# Patient Record
Sex: Female | Born: 1965
Health system: Southern US, Community
[De-identification: ages and names within clinical notes are randomized; demographics above are authoritative.]

## PROBLEM LIST (undated history)

## (undated) DIAGNOSIS — T7840XA Allergy, unspecified, initial encounter: Secondary | ICD-10-CM

## (undated) DIAGNOSIS — R5383 Other fatigue: Secondary | ICD-10-CM

## (undated) DIAGNOSIS — N760 Acute vaginitis: Secondary | ICD-10-CM

## (undated) DIAGNOSIS — D573 Sickle-cell trait: Secondary | ICD-10-CM

## (undated) DIAGNOSIS — E049 Nontoxic goiter, unspecified: Secondary | ICD-10-CM

## (undated) DIAGNOSIS — IMO0002 Reserved for concepts with insufficient information to code with codable children: Secondary | ICD-10-CM

## (undated) DIAGNOSIS — Z8619 Personal history of other infectious and parasitic diseases: Secondary | ICD-10-CM

## (undated) DIAGNOSIS — F329 Major depressive disorder, single episode, unspecified: Secondary | ICD-10-CM

## (undated) DIAGNOSIS — Z0000 Encounter for general adult medical examination without abnormal findings: Secondary | ICD-10-CM

## (undated) DIAGNOSIS — J302 Other seasonal allergic rhinitis: Secondary | ICD-10-CM

## (undated) DIAGNOSIS — F32A Depression, unspecified: Secondary | ICD-10-CM

## (undated) DIAGNOSIS — K529 Noninfective gastroenteritis and colitis, unspecified: Principal | ICD-10-CM

## (undated) DIAGNOSIS — J4520 Mild intermittent asthma, uncomplicated: Secondary | ICD-10-CM

## (undated) DIAGNOSIS — H547 Unspecified visual loss: Secondary | ICD-10-CM

## (undated) DIAGNOSIS — A048 Other specified bacterial intestinal infections: Secondary | ICD-10-CM

## (undated) DIAGNOSIS — M545 Low back pain: Secondary | ICD-10-CM

## (undated) HISTORY — DX: Other seasonal allergic rhinitis: J30.2

## (undated) HISTORY — DX: Nontoxic goiter, unspecified: E04.9

## (undated) HISTORY — DX: Reserved for concepts with insufficient information to code with codable children: IMO0002

## (undated) HISTORY — DX: Mild intermittent asthma, uncomplicated: J45.20

## (undated) HISTORY — DX: Major depressive disorder, single episode, unspecified: F32.9

## (undated) HISTORY — DX: Other specified bacterial intestinal infections: A04.8

## (undated) HISTORY — DX: Low back pain: M54.5

## (undated) HISTORY — DX: Allergy, unspecified, initial encounter: T78.40XA

## (undated) HISTORY — DX: Depression, unspecified: F32.A

## (undated) HISTORY — DX: Noninfective gastroenteritis and colitis, unspecified: K52.9

## (undated) HISTORY — DX: Unspecified visual loss: H54.7

## (undated) HISTORY — DX: Sickle-cell trait: D57.3

## (undated) HISTORY — DX: Other fatigue: R53.83

## (undated) HISTORY — DX: Personal history of other infectious and parasitic diseases: Z86.19

## (undated) HISTORY — DX: Acute vaginitis: N76.0

## (undated) HISTORY — DX: Encounter for general adult medical examination without abnormal findings: Z00.00

---

## 2009-06-05 LAB — HM MAMMOGRAPHY: HM Mammogram: NORMAL

## 2010-05-29 LAB — HM PAP SMEAR: HM Pap smear: NORMAL

## 2011-01-20 ENCOUNTER — Emergency Department (HOSPITAL_BASED_OUTPATIENT_CLINIC_OR_DEPARTMENT_OTHER)
Admission: EM | Admit: 2011-01-20 | Discharge: 2011-01-21 | Disposition: A | Discharge status: Home / Self Care | Payer: BC Managed Care – PPO | Attending: Emergency Medicine | Admitting: Emergency Medicine

## 2011-01-20 ENCOUNTER — Encounter: Payer: Self-pay | Admitting: *Deleted

## 2011-01-20 DIAGNOSIS — B349 Viral infection, unspecified: Secondary | ICD-10-CM

## 2011-01-20 DIAGNOSIS — J3489 Other specified disorders of nose and nasal sinuses: Secondary | ICD-10-CM | POA: Insufficient documentation

## 2011-01-20 DIAGNOSIS — B9789 Other viral agents as the cause of diseases classified elsewhere: Secondary | ICD-10-CM | POA: Insufficient documentation

## 2011-01-20 NOTE — ED Notes (Signed)
C/o inus pressure and cough x 2 days

## 2011-01-21 MED ORDER — OSELTAMIVIR PHOSPHATE 75 MG PO CAPS: 75.0000 mg | ORAL_CAPSULE | Freq: Two times a day (BID) | ORAL | Status: AC

## 2011-01-21 NOTE — ED Provider Notes (Signed)
History    This chart was scribed for Anne Co, MD, MD by Smitty Pluck. The patient was seen in room [facility] and the patient's care was started at 12:25AM.   CSN: [id number]  Arrival date & time 01/20/11  2315   First MD Initiated Contact with Patient 01/21/11 0004      Chief Complaint  Patient presents with  . Facial Pain    (Consider location/radiation/quality/duration/timing/severity/associated sxs/prior treatment) The history is provided by the patient.   Anne Huffman is a 45 y.o. female who presents to the Emergency Department complaining of moderate facial pain and pressure onset 2 days ago. Pt also has nasal congestion, cough, sore throat and generalized body aches. Pt denies chest pain, SOB, abdominal pain, diarrhea and vomiting. Pt has had sick contacts with relatives. She reports the symptoms are constant since onset.  PCP was Dr. Melvyn Novas.   History reviewed. No pertinent past medical history.  History reviewed. No pertinent past surgical history.  History reviewed. No pertinent family history.  History  Substance Use Topics  . Smoking status: Never Smoker   . Smokeless tobacco: Not on file  . Alcohol Use: No    OB History    Grav Para Term Preterm Abortions TAB SAB Ect Mult Living                  Review of Systems  All other systems reviewed and are negative.   10 Systems reviewed and are negative for acute change except as noted in the HPI.  Allergies  Review of patient's allergies indicates no known allergies.  Home Medications  No current outpatient prescriptions on file.  BP 133/81  Pulse 101  Temp(Src) 99 F (37.2 C) (Oral)  Resp 18  Ht 5\' 2"  (1.575 m)  Wt 140 lb (63.504 kg)  BMI 25.61 kg/m2  SpO2 100%  LMP 01/18/2011  Physical Exam  Nursing note and vitals reviewed. Constitutional: She is oriented to person, place, and time. She appears well-developed and well-nourished. No distress.  HENT:  Head: Normocephalic and atraumatic.    Right Ear: External ear normal.  Left Ear: External ear normal.  Mouth/Throat: Oropharynx is clear and moist. No oropharyngeal exudate.       Uvula midline  Tolerant secretions Airway patent   Eyes: EOM are normal. Pupils are equal, round, and reactive to light.  Neck: Normal range of motion. Neck supple. No tracheal deviation present.  Cardiovascular: Normal rate, regular rhythm and normal heart sounds.   Pulmonary/Chest: Effort normal. No respiratory distress.  Abdominal: Soft. She exhibits no distension.  Musculoskeletal: Normal range of motion.  Neurological: She is alert and oriented to person, place, and time.  Skin: Skin is warm and dry.  Psychiatric: She has a normal mood and affect. Her behavior is normal.    ED Course  Procedures (including critical care time)  DIAGNOSTIC STUDIES: Oxygen Saturation is 100% on room air, normal by my interpretation.    COORDINATION OF CARE:    Labs Reviewed - No data to display No results found.   1. Viral syndrome       MDM  The patient's symptoms are consistent with a viral syndrome.  Given the significant amount of influenza around I will cover the patient with Tamiflu and recommend close PCP followup    I personally performed the services described in this documentation, which was scribed in my presence. The recorded information has been reviewed and considered.    Anne Co, MD 01/21/11  0106 

## 2011-03-06 ENCOUNTER — Ambulatory Visit: Payer: BC Managed Care – PPO | Admitting: Internal Medicine

## 2011-03-11 ENCOUNTER — Encounter: Payer: Self-pay | Admitting: Internal Medicine

## 2011-03-11 ENCOUNTER — Ambulatory Visit (INDEPENDENT_AMBULATORY_CARE_PROVIDER_SITE_OTHER): Payer: BC Managed Care – PPO | Admitting: Internal Medicine

## 2011-03-11 DIAGNOSIS — J45909 Unspecified asthma, uncomplicated: Secondary | ICD-10-CM

## 2011-03-11 DIAGNOSIS — R748 Abnormal levels of other serum enzymes: Secondary | ICD-10-CM | POA: Insufficient documentation

## 2011-03-11 DIAGNOSIS — Z1239 Encounter for other screening for malignant neoplasm of breast: Secondary | ICD-10-CM

## 2011-03-11 DIAGNOSIS — J4520 Mild intermittent asthma, uncomplicated: Secondary | ICD-10-CM | POA: Insufficient documentation

## 2011-03-11 DIAGNOSIS — D649 Anemia, unspecified: Secondary | ICD-10-CM | POA: Insufficient documentation

## 2011-03-11 DIAGNOSIS — R7401 Elevation of levels of liver transaminase levels: Secondary | ICD-10-CM

## 2011-03-11 HISTORY — DX: Mild intermittent asthma, uncomplicated: J45.20

## 2011-03-11 HISTORY — PX: NO PAST SURGERIES: SHX2092

## 2011-03-11 MED ORDER — ALBUTEROL 90 MCG/ACT IN AERS: 2.0000 | INHALATION_SPRAY | Freq: Four times a day (QID) | RESPIRATORY_TRACT | Status: DC | PRN

## 2011-03-11 NOTE — Assessment & Plan Note (Signed)
Obtain cbc. Bring previous labs to clinic for review

## 2011-03-11 NOTE — Assessment & Plan Note (Signed)
Asx. Provide prescription for albuterol prn rescue inhaler

## 2011-03-11 NOTE — Assessment & Plan Note (Signed)
Obtain lft. Bring previous labs/records to clinic for review

## 2011-03-11 NOTE — Assessment & Plan Note (Signed)
Schedule annual mammogram. Recommend monthly self breast exam.

## 2011-03-11 NOTE — Progress Notes (Signed)
  Subjective:    Patient ID: Anne Huffman, female    DOB: Jun 03, 1965, 46 y.o.   MRN: [id number]  HPI  Pt presents to clinic to establish care and followup of multiple medical problems. H/o asthma with no recent flare, dyspnea or wheezing. Does not have rescue inhaler at home. H/o mild depression which has not required medication and which she believes is under control. Pap utd 5/12 and nl and last mammogram 2011 but willing to schedule for annual f/u. Recalls labs 6/12 through previous pmd and believes was told anemic vs leukopenic and also ? Abn lft with subsequent abdominal US. Has copy of labs at home. Believes tetanus utd.  Past Medical History  Diagnosis Date  . Asthma     since childhood  . History of chicken pox     childhood  . Depression     no treatment  . Ulcer     stomach  . Seasonal allergies    Past Surgical History  Procedure Date  . No past surgeries 03/11/2011    reports that she has never smoked. She has never used smokeless tobacco. She reports that she does not drink alcohol or use illicit drugs. family history includes Breast cancer in her maternal grandmother and Hypertension in her mother.  There is no history of Prostate cancer, and Colon cancer, and Heart disease, and Diabetes, . No Known Allergies   Review of Systems  Respiratory: Negative for cough, shortness of breath and wheezing.   Cardiovascular: Negative for chest pain.  Gastrointestinal: Negative for blood in stool.  All other systems reviewed and are negative.       Objective:   Physical Exam  Physical Exam  Nursing note and vitals reviewed. Constitutional: Appears well-developed and well-nourished. No distress.  HENT:  Head: Normocephalic and atraumatic.  Right Ear: External ear normal.  Left Ear: External ear normal.  Eyes: Conjunctivae are normal. No scleral icterus.  Neck: Neck supple. Carotid bruit is not present.  Cardiovascular: Normal rate, regular rhythm and normal heart  sounds.  Exam reveals no gallop and no friction rub.   No murmur heard. Pulmonary/Chest: Effort normal and breath sounds normal. No respiratory distress. He has no wheezes. no rales.  Lymphadenopathy:    He has no cervical adenopathy.  Neurological:Alert.  Skin: Skin is warm and dry. Not diaphoretic.  Psychiatric: Has a normal mood and affect.         Assessment & Plan:

## 2011-03-12 LAB — CBC WITH DIFFERENTIAL/PLATELET
Basophils Absolute: 0 10*3/uL (ref 0.0–0.1)
Basophils Relative: 0 % (ref 0–1)
Eosinophils Absolute: 0.2 10*3/uL (ref 0.0–0.7)
Eosinophils Relative: 4 % (ref 0–5)
HCT: 38.9 % (ref 36.0–46.0)
Hemoglobin: 13.1 g/dL (ref 12.0–15.0)
Lymphocytes Relative: 34 % (ref 12–46)
Lymphs Abs: 2.1 10*3/uL (ref 0.7–4.0)
MCH: 29.3 pg (ref 26.0–34.0)
MCHC: 33.7 g/dL (ref 30.0–36.0)
MCV: 87 fL (ref 78.0–100.0)
Monocytes Absolute: 0.5 10*3/uL (ref 0.1–1.0)
Monocytes Relative: 7 % (ref 3–12)
Neutro Abs: 3.4 10*3/uL (ref 1.7–7.7)
Neutrophils Relative %: 56 % (ref 43–77)
Platelets: 346 10*3/uL (ref 150–400)
RBC: 4.47 MIL/uL (ref 3.87–5.11)
RDW: 12.8 % (ref 11.5–15.5)
WBC: 6.2 10*3/uL (ref 4.0–10.5)

## 2011-03-12 LAB — HEPATIC FUNCTION PANEL
ALT: 10 U/L (ref 0–35)
AST: 14 U/L (ref 0–37)
Albumin: 4 g/dL (ref 3.5–5.2)
Alkaline Phosphatase: 71 U/L (ref 39–117)
Bilirubin, Direct: 0.1 mg/dL (ref 0.0–0.3)
Indirect Bilirubin: 0.3 mg/dL (ref 0.0–0.9)
Total Bilirubin: 0.4 mg/dL (ref 0.3–1.2)
Total Protein: 7.2 g/dL (ref 6.0–8.3)

## 2011-03-29 ENCOUNTER — Telehealth: Payer: Self-pay | Admitting: Internal Medicine

## 2011-03-29 NOTE — Telephone Encounter (Signed)
Received medical records from [facility]

## 2011-04-02 LAB — HM MAMMOGRAPHY

## 2011-04-03 ENCOUNTER — Encounter: Payer: Self-pay | Admitting: Internal Medicine

## 2011-04-04 ENCOUNTER — Ambulatory Visit (HOSPITAL_BASED_OUTPATIENT_CLINIC_OR_DEPARTMENT_OTHER)
Admission: RE | Admit: 2011-04-04 | Discharge: 2011-04-04 | Disposition: A | Discharge status: Home / Self Care | Payer: BC Managed Care – PPO | Source: Ambulatory Visit | Attending: Internal Medicine | Admitting: Internal Medicine

## 2011-04-04 ENCOUNTER — Encounter: Payer: Self-pay | Admitting: Internal Medicine

## 2011-04-04 ENCOUNTER — Ambulatory Visit (INDEPENDENT_AMBULATORY_CARE_PROVIDER_SITE_OTHER): Payer: BC Managed Care – PPO | Admitting: Internal Medicine

## 2011-04-04 VITALS — BP 120/78 | HR 83 | Temp 98.1°F | Resp 16 | Ht 63.0 in | Wt 160.0 lb

## 2011-04-04 DIAGNOSIS — IMO0002 Reserved for concepts with insufficient information to code with codable children: Secondary | ICD-10-CM

## 2011-04-04 DIAGNOSIS — M25519 Pain in unspecified shoulder: Secondary | ICD-10-CM

## 2011-04-04 DIAGNOSIS — M792 Neuralgia and neuritis, unspecified: Secondary | ICD-10-CM

## 2011-04-04 DIAGNOSIS — R918 Other nonspecific abnormal finding of lung field: Secondary | ICD-10-CM

## 2011-04-04 DIAGNOSIS — M542 Cervicalgia: Secondary | ICD-10-CM

## 2011-04-04 MED ORDER — METHYLPREDNISOLONE 4 MG PO KIT: PACK | ORAL | Status: AC

## 2011-04-06 DIAGNOSIS — M792 Neuralgia and neuritis, unspecified: Secondary | ICD-10-CM | POA: Insufficient documentation

## 2011-04-06 DIAGNOSIS — M542 Cervicalgia: Secondary | ICD-10-CM | POA: Insufficient documentation

## 2011-04-06 NOTE — Assessment & Plan Note (Signed)
Plain xray of cpsine. Attempt medrol dosepak. Work note provided. Followup if no improvement or worsening.

## 2011-04-06 NOTE — Assessment & Plan Note (Signed)
Obtain plain xray. 

## 2011-04-06 NOTE — Progress Notes (Signed)
  Subjective:    Patient ID: Anne Huffman, female    DOB: 08-02-1965, 46 y.o.   MRN: [id number]  HPI Pt presents to clinic for evaluation of hand and arm pain. Notes 2 d h/o right arm pain that radiates to hand. +hand paresthesia without muscle weakness, neck pain or h/o injury. Taking no medication for the problem. No alleviating or exacerbating factors. States has experienced a similar episode months ago that resolved spontaneously.  Past Medical History  Diagnosis Date  . Asthma     since childhood  . History of chicken pox     childhood  . Depression     no treatment  . Ulcer     stomach  . Seasonal allergies    Past Surgical History  Procedure Date  . No past surgeries 03/11/2011    reports that she has never smoked. She has never used smokeless tobacco. She reports that she does not drink alcohol or use illicit drugs. family history includes Breast cancer in her maternal grandmother and Hypertension in her mother.  There is no history of Prostate cancer, and Colon cancer, and Heart disease, and Diabetes, . No Known Allergies   Review of Systems see hpi     Objective:   Physical Exam  Nursing note and vitals reviewed. Constitutional: She appears well-developed and well-nourished. No distress.  HENT:  Head: Normocephalic and atraumatic.  Musculoskeletal:       FROM right shoulder and arm. +ant shoulder tenderness. No crepitus. No post neck pain. Right distal hand strength 5/5.  Neurological: She is alert.  Skin: Skin is warm and dry. She is not diaphoretic.          Assessment & Plan:

## 2011-04-11 ENCOUNTER — Other Ambulatory Visit: Payer: Self-pay | Admitting: Internal Medicine

## 2011-04-11 DIAGNOSIS — J984 Other disorders of lung: Secondary | ICD-10-CM

## 2011-04-15 ENCOUNTER — Ambulatory Visit (HOSPITAL_BASED_OUTPATIENT_CLINIC_OR_DEPARTMENT_OTHER)
Admission: RE | Admit: 2011-04-15 | Discharge: 2011-04-15 | Disposition: A | Discharge status: Home / Self Care | Payer: BC Managed Care – PPO | Source: Ambulatory Visit | Attending: Internal Medicine | Admitting: Internal Medicine

## 2011-04-15 ENCOUNTER — Other Ambulatory Visit: Payer: Self-pay | Admitting: Internal Medicine

## 2011-04-15 DIAGNOSIS — J984 Other disorders of lung: Secondary | ICD-10-CM

## 2011-04-15 DIAGNOSIS — R918 Other nonspecific abnormal finding of lung field: Secondary | ICD-10-CM

## 2011-04-26 ENCOUNTER — Ambulatory Visit: Payer: BC Managed Care – PPO | Admitting: Internal Medicine

## 2011-04-26 DIAGNOSIS — Z0289 Encounter for other administrative examinations: Secondary | ICD-10-CM

## 2011-05-03 ENCOUNTER — Telehealth: Payer: Self-pay | Admitting: *Deleted

## 2011-05-03 ENCOUNTER — Ambulatory Visit: Payer: BC Managed Care – PPO | Admitting: Internal Medicine

## 2011-05-03 DIAGNOSIS — Z0289 Encounter for other administrative examinations: Secondary | ICD-10-CM

## 2011-05-03 NOTE — Telephone Encounter (Signed)
Call placed to patient at [phone], no answer. A voice message was left for patient to return call regarding scheduling of CT.

## 2011-05-06 NOTE — Telephone Encounter (Signed)
Call placed to patient at [phone], voice recording reached; stating "the person you have called is unavailable please try your call again later".

## 2011-05-07 NOTE — Telephone Encounter (Signed)
Call placed to patient at [phone], voice recording reached stating "the person you have called is unavailable, please try your call again later". Letter mailed to patients address on file. She was advised to contact office if she would like to proceed with CT Scan.

## 2011-05-13 ENCOUNTER — Telehealth: Payer: Self-pay | Admitting: *Deleted

## 2011-05-13 ENCOUNTER — Other Ambulatory Visit: Payer: Self-pay | Admitting: Internal Medicine

## 2011-05-13 DIAGNOSIS — R911 Solitary pulmonary nodule: Secondary | ICD-10-CM

## 2011-05-13 NOTE — Telephone Encounter (Signed)
Has clinic appt tomorrow to discuss. Can cancel that appt if she wants. i'll order ct. May need help to get someone to look at report friday

## 2011-05-13 NOTE — Telephone Encounter (Signed)
Patient returned phone call stating she would like to proceed with having the CT Scan done on Friday 05/17/2011. Her message stated that she will be off that day and would be able to come in at any time.

## 2011-05-14 NOTE — Telephone Encounter (Signed)
Call placed to patient at [phone], voice recording reached stating the person you have called is unavailable right now, please try your call again late. Call placed to patient at [phone], same voice recording reached. Unable to leave message.

## 2011-05-15 ENCOUNTER — Ambulatory Visit (INDEPENDENT_AMBULATORY_CARE_PROVIDER_SITE_OTHER): Payer: BC Managed Care – PPO | Admitting: Internal Medicine

## 2011-05-15 ENCOUNTER — Ambulatory Visit (HOSPITAL_BASED_OUTPATIENT_CLINIC_OR_DEPARTMENT_OTHER)
Admission: RE | Admit: 2011-05-15 | Discharge: 2011-05-15 | Disposition: A | Discharge status: Home / Self Care | Payer: BC Managed Care – PPO | Source: Ambulatory Visit | Attending: Internal Medicine | Admitting: Internal Medicine

## 2011-05-15 ENCOUNTER — Encounter: Payer: Self-pay | Admitting: Internal Medicine

## 2011-05-15 VITALS — BP 120/72 | HR 68 | Temp 98.6°F | Ht 63.0 in | Wt 166.0 lb

## 2011-05-15 DIAGNOSIS — R911 Solitary pulmonary nodule: Secondary | ICD-10-CM

## 2011-05-15 MED ORDER — IOHEXOL 300 MG/ML  SOLN
80.0000 mL | Freq: Once | INTRAMUSCULAR | Status: AC | PRN
  Administered 2011-05-15: 80 mL via INTRAVENOUS

## 2011-05-15 NOTE — Telephone Encounter (Signed)
Patient seen for office visit 05/15/2011.

## 2011-05-16 DIAGNOSIS — R911 Solitary pulmonary nodule: Secondary | ICD-10-CM | POA: Insufficient documentation

## 2011-05-16 NOTE — Assessment & Plan Note (Signed)
Proceed with chest ct with contrast.

## 2011-05-16 NOTE — Progress Notes (Signed)
  Subjective:    Patient ID: Anne Huffman, female    DOB: [birth year], 46 y.o.   MRN: [id number]  HPI Pt presents to clinic for evaluation of abnormal cxr. Seen for shoulder/arm pain essentially resolved with partial medrol dosepak. Plain xray of shoulder suggested possible lung abn. cxr performed and demonstrated possible 6mm rul nodule. No tobacco hx. Denies cough, sweats, wt loss or hemoptysis. No active complaint.  Past Medical History  Diagnosis Date  . Asthma     since childhood  . History of chicken pox     childhood  . Depression     no treatment  . Ulcer     stomach  . Seasonal allergies    Past Surgical History  Procedure Date  . No past surgeries 03/11/2011    reports that she has never smoked. She has never used smokeless tobacco. She reports that she does not drink alcohol or use illicit drugs. family history includes Breast cancer in her maternal grandmother and Hypertension in her mother.  There is no history of Prostate cancer, and Colon cancer, and Heart disease, and Diabetes, . No Known Allergies   Review of Systems see hpi     Objective:   Physical Exam  Nursing note and vitals reviewed. Constitutional: She appears well-developed and well-nourished. No distress.  Neurological: She is alert.  Skin: She is not diaphoretic.  Psychiatric: She has a normal mood and affect.          Assessment & Plan:

## 2011-05-17 ENCOUNTER — Other Ambulatory Visit (HOSPITAL_BASED_OUTPATIENT_CLINIC_OR_DEPARTMENT_OTHER): Payer: BC Managed Care – PPO

## 2011-05-23 ENCOUNTER — Telehealth: Payer: Self-pay | Admitting: Internal Medicine

## 2011-05-23 NOTE — Telephone Encounter (Signed)
Call placed to patient at [phone], she was advised of Ct results per Dr. Rodena Medin instructions, verbalized understanding and agrees as instructed.

## 2011-05-23 NOTE — Telephone Encounter (Signed)
She received a letter with her ct results Should she make an appointment to see Dr Rodena Medin about the results as she has some questions or should she wait the 12 months as the letter said.

## 2011-06-03 ENCOUNTER — Ambulatory Visit: Payer: BC Managed Care – PPO | Admitting: Internal Medicine

## 2011-06-03 DIAGNOSIS — Z0289 Encounter for other administrative examinations: Secondary | ICD-10-CM

## 2011-07-18 ENCOUNTER — Encounter: Payer: Self-pay | Admitting: Internal Medicine

## 2011-07-18 ENCOUNTER — Ambulatory Visit (INDEPENDENT_AMBULATORY_CARE_PROVIDER_SITE_OTHER): Payer: BC Managed Care – PPO | Admitting: Internal Medicine

## 2011-07-18 VITALS — BP 100/70 | HR 68 | Temp 98.5°F | Resp 18

## 2011-07-18 DIAGNOSIS — L255 Unspecified contact dermatitis due to plants, except food: Secondary | ICD-10-CM

## 2011-07-18 DIAGNOSIS — L309 Dermatitis, unspecified: Secondary | ICD-10-CM

## 2011-07-18 DIAGNOSIS — L259 Unspecified contact dermatitis, unspecified cause: Secondary | ICD-10-CM

## 2011-07-18 DIAGNOSIS — L237 Allergic contact dermatitis due to plants, except food: Secondary | ICD-10-CM

## 2011-07-18 MED ORDER — TRIAMCINOLONE ACETONIDE 0.1 % EX CREA: TOPICAL_CREAM | Freq: Two times a day (BID) | CUTANEOUS | Status: AC

## 2011-07-18 MED ORDER — METHYLPREDNISOLONE ACETATE 20 MG/ML IJ SUSP
20.0000 mg | Freq: Once | INTRAMUSCULAR | Status: AC
  Administered 2011-07-18: 20 mg via INTRAMUSCULAR

## 2011-07-18 NOTE — Assessment & Plan Note (Signed)
Given depomedrol injxn IM. Begin triamcinolone bid. Followup if no improvement or worsening.

## 2011-07-18 NOTE — Progress Notes (Signed)
  Subjective:    Patient ID: Anne Huffman, female    DOB: 07/08/1965, 46 y.o.   MRN: [id number]  HPI Pt presents to clinic for evaluation of rash. Notes 1/5 week h/o bilateral dorsal foot rash believed to be poison ivy. + itching. Is using calamine lotion and otc hydrocortisone prn. Has some mild involvement of lower posterior legs and right arm but no oral or ocular involvement. No alleviating or exacerbating factors.   Past Medical History  Diagnosis Date  . Asthma     since childhood  . History of chicken pox     childhood  . Depression     no treatment  . Ulcer     stomach  . Seasonal allergies    Past Surgical History  Procedure Date  . No past surgeries 03/11/2011    reports that she has never smoked. She has never used smokeless tobacco. She reports that she does not drink alcohol or use illicit drugs. family history includes Breast cancer in her maternal grandmother and Hypertension in her mother.  There is no history of Prostate cancer, and Colon cancer, and Heart disease, and Diabetes, . No Known Allergies     Review of Systems see hpi     Objective:   Physical Exam  Nursing note and vitals reviewed. Constitutional: She appears well-developed and well-nourished. No distress.  HENT:  Head: Normocephalic and atraumatic.  Right Ear: External ear normal.  Left Ear: External ear normal.  Eyes: Conjunctivae are normal.  Skin: Skin is warm and dry. Rash noted. She is not diaphoretic.       Vesicular/papular rash involving bilateral dorsal feet and toes. No evidence of secondary bacterial infxn.   Psychiatric: She has a normal mood and affect.          Assessment & Plan:

## 2012-02-21 ENCOUNTER — Emergency Department (HOSPITAL_BASED_OUTPATIENT_CLINIC_OR_DEPARTMENT_OTHER)
Admission: EM | Admit: 2012-02-21 | Discharge: 2012-02-21 | Disposition: A | Discharge status: Home / Self Care | Payer: BC Managed Care – PPO | Attending: Emergency Medicine | Admitting: Emergency Medicine

## 2012-02-21 ENCOUNTER — Encounter (HOSPITAL_BASED_OUTPATIENT_CLINIC_OR_DEPARTMENT_OTHER): Payer: Self-pay | Admitting: *Deleted

## 2012-02-21 DIAGNOSIS — Z8619 Personal history of other infectious and parasitic diseases: Secondary | ICD-10-CM | POA: Insufficient documentation

## 2012-02-21 DIAGNOSIS — B349 Viral infection, unspecified: Secondary | ICD-10-CM

## 2012-02-21 DIAGNOSIS — Z8659 Personal history of other mental and behavioral disorders: Secondary | ICD-10-CM | POA: Insufficient documentation

## 2012-02-21 DIAGNOSIS — J45909 Unspecified asthma, uncomplicated: Secondary | ICD-10-CM | POA: Insufficient documentation

## 2012-02-21 DIAGNOSIS — Z8719 Personal history of other diseases of the digestive system: Secondary | ICD-10-CM | POA: Insufficient documentation

## 2012-02-21 DIAGNOSIS — Z3202 Encounter for pregnancy test, result negative: Secondary | ICD-10-CM | POA: Insufficient documentation

## 2012-02-21 DIAGNOSIS — B9789 Other viral agents as the cause of diseases classified elsewhere: Secondary | ICD-10-CM | POA: Insufficient documentation

## 2012-02-21 DIAGNOSIS — R197 Diarrhea, unspecified: Secondary | ICD-10-CM | POA: Insufficient documentation

## 2012-02-21 LAB — URINALYSIS, ROUTINE W REFLEX MICROSCOPIC
Glucose, UA: NEGATIVE mg/dL
Ketones, ur: 40 mg/dL — AB
Leukocytes, UA: NEGATIVE
Nitrite: NEGATIVE
Protein, ur: NEGATIVE mg/dL
Specific Gravity, Urine: 1.037 — ABNORMAL HIGH (ref 1.005–1.030)
Urobilinogen, UA: 0.2 mg/dL (ref 0.0–1.0)
pH: 5 (ref 5.0–8.0)

## 2012-02-21 LAB — BASIC METABOLIC PANEL
BUN: 15 mg/dL (ref 6–23)
CO2: 19 mEq/L (ref 19–32)
Calcium: 9.3 mg/dL (ref 8.4–10.5)
Chloride: 103 mEq/L (ref 96–112)
Creatinine, Ser: 0.7 mg/dL (ref 0.50–1.10)
GFR calc Af Amer: >90 mL/min (ref >90)
GFR calc non Af Amer: >90 mL/min (ref >90)
Glucose, Bld: 124 mg/dL — ABNORMAL HIGH (ref 70–99)
Potassium: 3.8 mEq/L (ref 3.5–5.1)
Sodium: 135 mEq/L (ref 135–145)

## 2012-02-21 LAB — PREGNANCY, URINE: Preg Test, Ur: NEGATIVE

## 2012-02-21 LAB — URINE MICROSCOPIC-ADD ON

## 2012-02-21 MED ORDER — ONDANSETRON 4 MG PO TBDP: 4.0000 mg | ORAL_TABLET | Freq: Three times a day (TID) | ORAL | Status: DC | PRN

## 2012-02-21 MED ORDER — ONDANSETRON HCL 4 MG/2ML IJ SOLN
4.0000 mg | Freq: Once | INTRAMUSCULAR | Status: AC
  Administered 2012-02-21: 4 mg via INTRAVENOUS
  Filled 2012-02-21: qty 2

## 2012-02-21 MED ORDER — SODIUM CHLORIDE 0.9 % IV BOLUS (SEPSIS)
1000.0000 mL | Freq: Once | INTRAVENOUS | Status: AC
  Administered 2012-02-21: 1000 mL via INTRAVENOUS

## 2012-02-21 NOTE — ED Notes (Signed)
Headache, epigastric pain, aching all over, weak, diarrhea, cough and possible fever since this am.

## 2012-02-21 NOTE — ED Provider Notes (Signed)
History     CSN: [id number]  Arrival date & time 02/21/12  4098   First MD Initiated Contact with Patient 02/21/12 2022      Chief Complaint  Patient presents with  . Headache    (Consider location/radiation/quality/duration/timing/severity/associated sxs/prior treatment) HPI Comments: Pt states that she has had diarrhea, nausea, headache and a cough for the last couple of days:pt states that her mother had a stomach virus in the last week:pt denies vomiting fever or abdominal pain:pt states that she feels like she is dehydrated  The history is provided by the patient. No language interpreter was used.    Past Medical History  Diagnosis Date  . Asthma     since childhood  . History of chicken pox     childhood  . Depression     no treatment  . Ulcer     stomach  . Seasonal allergies     Past Surgical History  Procedure Date  . No past surgeries 03/11/2011    Family History  Problem Relation Age of Onset  . Breast cancer Maternal Grandmother   . Hypertension Mother     maternal aunt  . Prostate cancer Neg Hx   . Colon cancer Neg Hx   . Heart disease Neg Hx   . Diabetes Neg Hx     History  Substance Use Topics  . Smoking status: Never Smoker   . Smokeless tobacco: Never Used  . Alcohol Use: No    OB History    Grav Para Term Preterm Abortions TAB SAB Ect Mult Living                  Review of Systems  Constitutional: Negative for activity change.  Respiratory: Negative.   Cardiovascular: Negative.     Allergies  Review of patient's allergies indicates no known allergies.  Home Medications   Current Outpatient Rx  Name  Route  Sig  Dispense  Refill  . TRIAMCINOLONE ACETONIDE 0.1 % EX CREA   Topical   Apply topically 2 (two) times daily.   60 g   0     BP 118/70  Pulse 108  Temp 98.4 F (36.9 C) (Oral)  Resp 16  Ht 5\' 3"  (1.6 m)  Wt 140 lb (63.504 kg)  BMI 24.80 kg/m2  SpO2 100%  Physical Exam  Nursing note and vitals  reviewed. Constitutional: She is oriented to person, place, and time. She appears well-developed and well-nourished.  HENT:  Head: Normocephalic and atraumatic.  Right Ear: External ear normal.  Left Ear: External ear normal.  Eyes: Conjunctivae normal and EOM are normal. Pupils are equal, round, and reactive to light.  Cardiovascular: Normal rate and regular rhythm.   Pulmonary/Chest: Effort normal and breath sounds normal.  Abdominal: Soft. Bowel sounds are normal.  Musculoskeletal: Normal range of motion.  Neurological: She is alert and oriented to person, place, and time.  Skin: Skin is warm and dry.  Psychiatric: She has a normal mood and affect.    ED Course  Procedures (including critical care time)  Labs Reviewed  URINALYSIS, ROUTINE W REFLEX MICROSCOPIC - Abnormal; Notable for the following:    Color, Urine AMBER (*)  BIOCHEMICALS MAY BE AFFECTED BY COLOR   APPearance CLOUDY (*)     Specific Gravity, Urine 1.037 (*)     Hgb urine dipstick SMALL (*)     Bilirubin Urine SMALL (*)     Ketones, ur 40 (*)     All other  components within normal limits  URINE MICROSCOPIC-ADD ON - Abnormal; Notable for the following:    Squamous Epithelial / LPF FEW (*)     Bacteria, UA FEW (*)     All other components within normal limits  BASIC METABOLIC PANEL - Abnormal; Notable for the following:    Glucose, Bld 124 (*)     All other components within normal limits  PREGNANCY, URINE   No results found.   1. Diarrhea   2. Viral illness       MDM  Pt feeling better at this time and is tolerating po        Teressa Lower, NP 02/21/12 2145

## 2012-02-22 NOTE — ED Provider Notes (Signed)
Medical screening examination/treatment/procedure(s) were performed by non-physician practitioner and as supervising physician I was immediately available for consultation/collaboration.  [place] Souter, MD 02/22/12 0042 

## 2012-04-28 ENCOUNTER — Encounter: Payer: Self-pay | Admitting: Internal Medicine

## 2012-05-04 IMAGING — CT CT CHEST W/ CM
2 of 3 series · 15 of 36 positions shown, 18 images · IV contrast (APPLIED)
Comparison: Chest x-ray from 04/15/2011

CLINICAL DATA: Right upper lobe lung nodule.

CT CHEST WITH CONTRAST
TECHNIQUE: Multidetector CT imaging of the chest was performed
following the standard protocol during bolus administration of
intravenous contrast.
Contrast: 80mL OMNIPAQUE IOHEXOL 300 MG/ML  SOLN

[Series 2: chest 5.0 b31f · axial · 0.59mm/px · z∈[-246,-16]mm · 12 of 55 slices shown, 15 images]
[im 5/55  mediastinal]
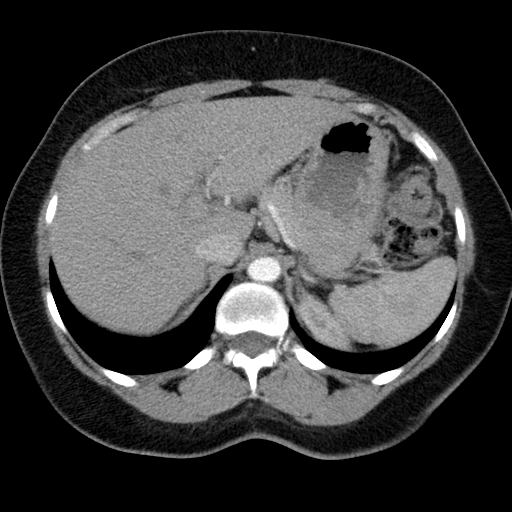
[im 5/55  lung]
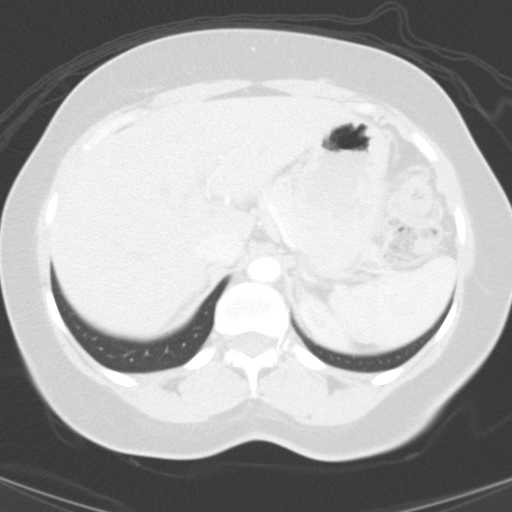
[im 9/55  lung]
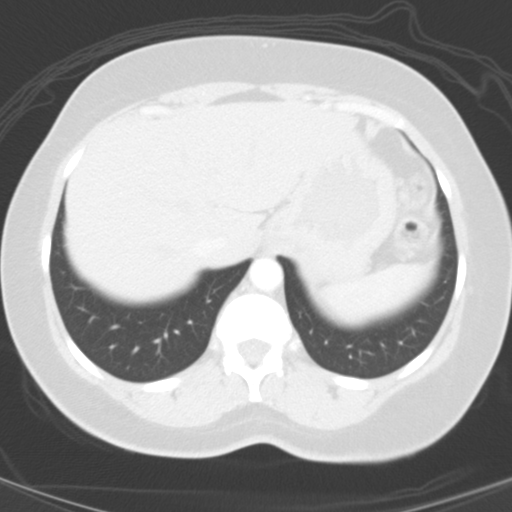
[im 13/55  lung]
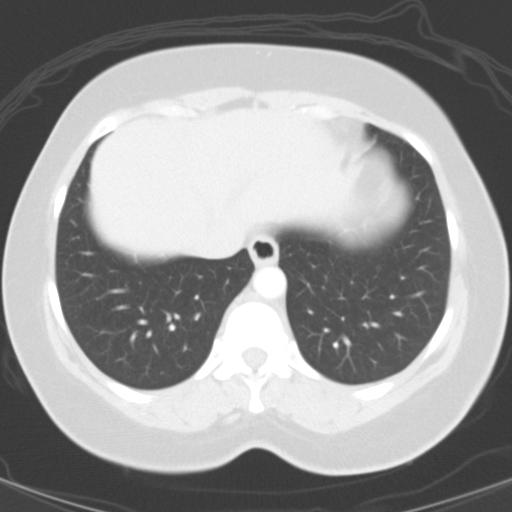
[im 17/55  lung]
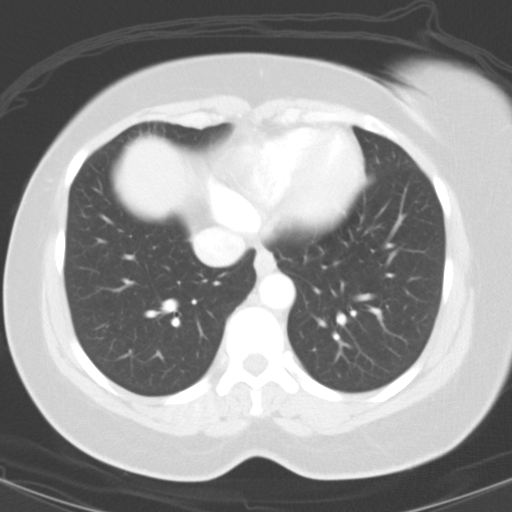
[im 21/55  mediastinal]
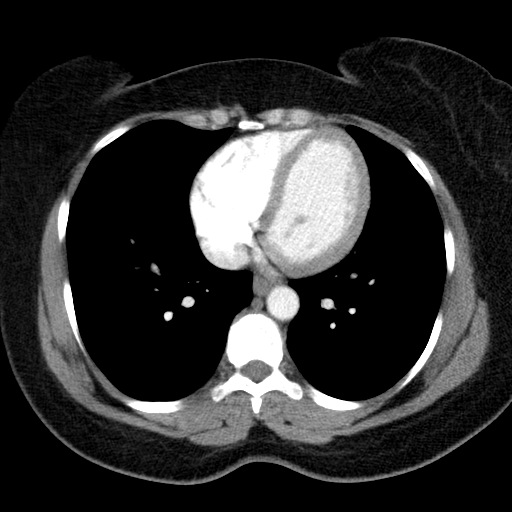
[im 21/55  lung]
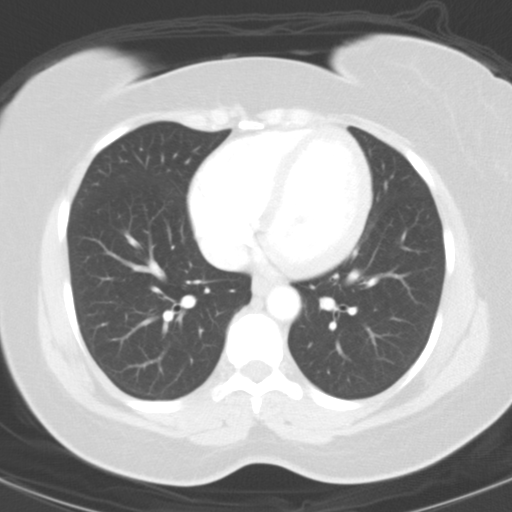
[im 25/55  lung]
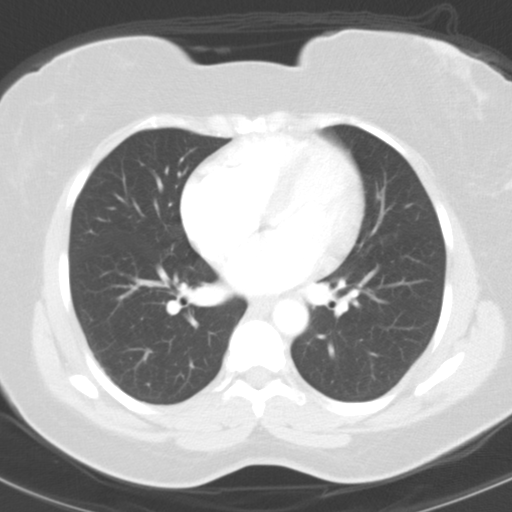
[im 31/55  lung]
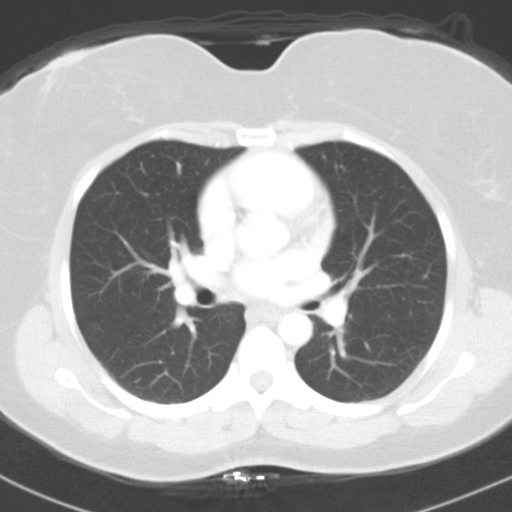
[im 35/55  lung]
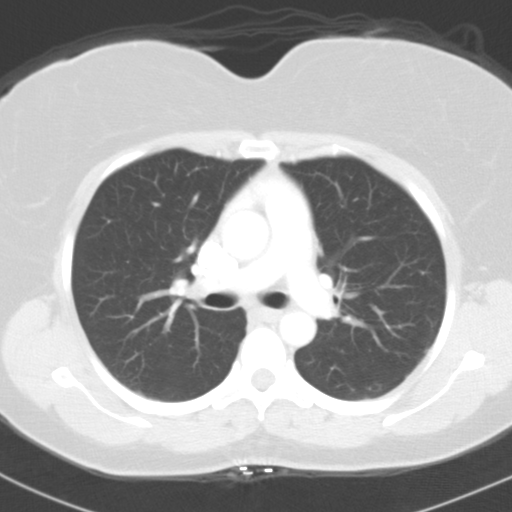
[im 39/55  mediastinal]
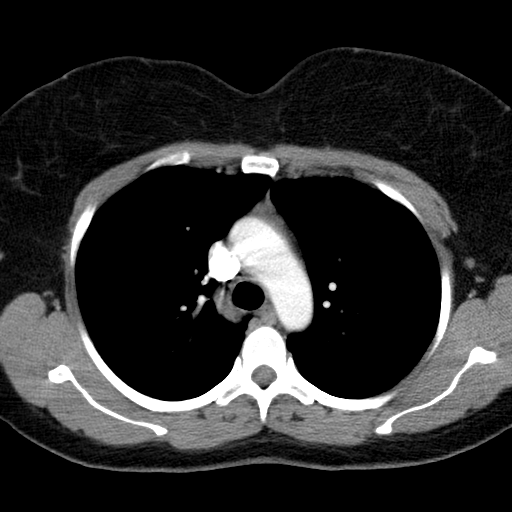
[im 39/55  lung]
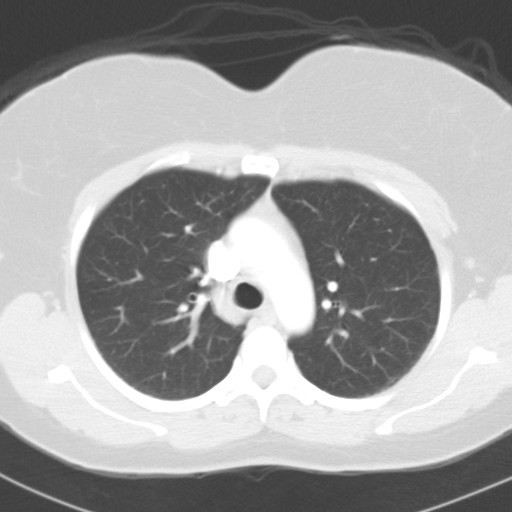
[im 43/55  lung]
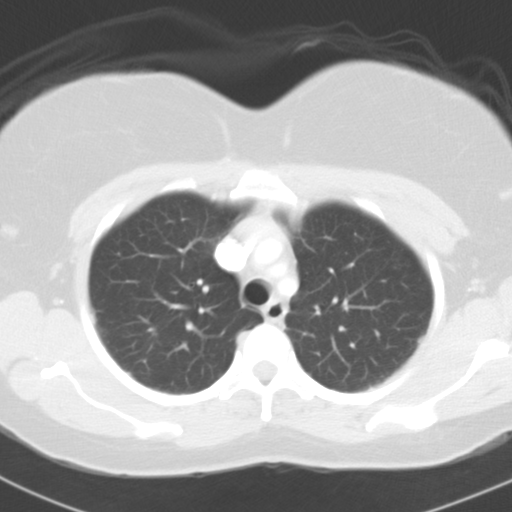
[im 47/55  lung]
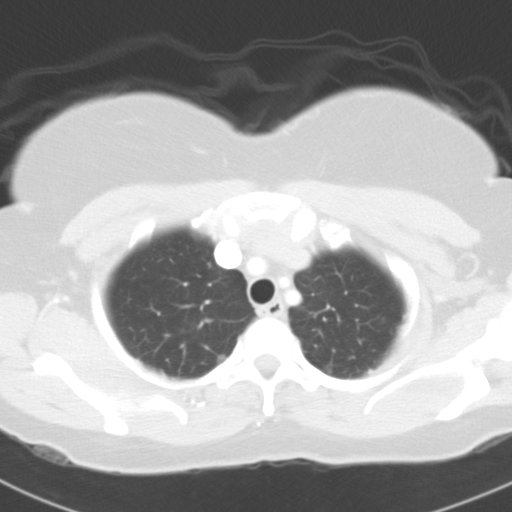
[im 51/55  lung]
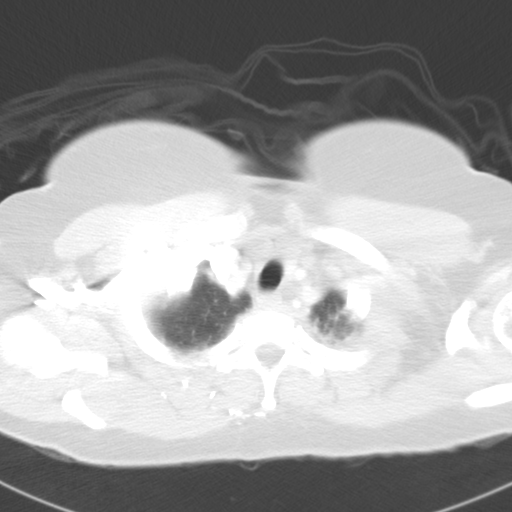

[Series 6: chest 3.0 coronal · coronal · 0.55mm/px · 3 of 86 slices shown]
[im 18/86  lung]
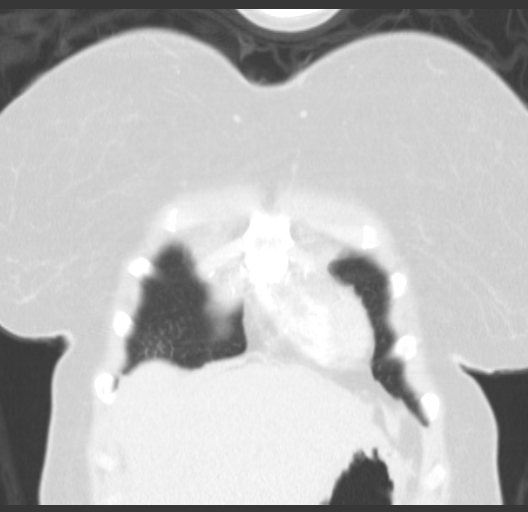
[im 35/86  lung]
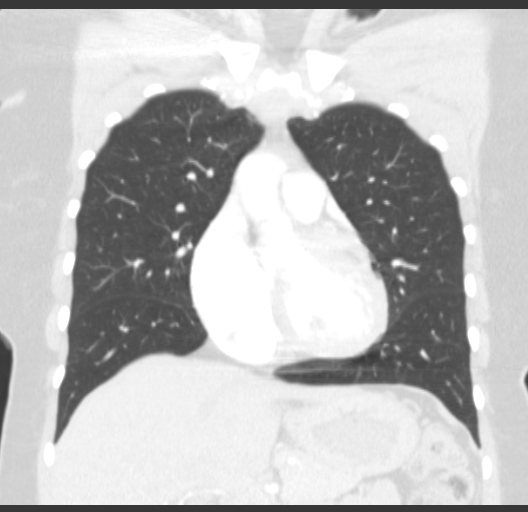
[im 52/86  lung]
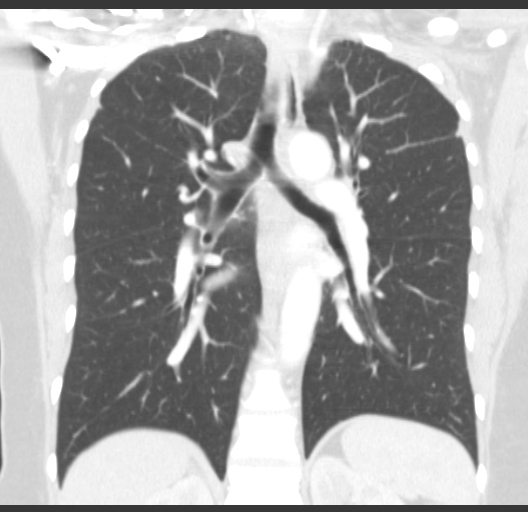

[15 of 36 positions shown; findings below may reference images not displayed]

FINDINGS: There is no axillary, mediastinal, or hilar
lymphadenopathy.  The heart size is normal.  There is no
pericardial or pleural effusion.

There is some peripheral pleural parenchymal nodularity bilaterally
and a fairly symmetric fashion.  6 mm right upper lobe pulmonary
nodules seen on image 16.  5 mm right middle lobe pulmonary nodule
is seen on image 29.  5 mm left lower lobe pulmonary nodules seen
on image 27.

Bone windows reveal no worrisome lytic or sclerotic osseous
lesions.
IMPRESSION: The several tiny pulmonary nodules are identified bilaterally.  The
largest of these is in the right upper lobe, measuring 6 mm.  No
reported history of primary malignancy to raise concern for
metastatic disease. If the patient is at high risk for bronchogenic
carcinoma, follow-up chest CT at 6-12 months is recommended.  If
the patient is at low risk for bronchogenic carcinoma, follow-up
chest CT at 12 months is recommended.  This recommendation follows
the consensus statement: Guidelines for Management of Small
Pulmonary Nodules Detected on CT Scans: A Statement from the

.

## 2012-05-11 ENCOUNTER — Ambulatory Visit: Payer: BC Managed Care – PPO | Admitting: Internal Medicine

## 2012-08-17 ENCOUNTER — Ambulatory Visit (INDEPENDENT_AMBULATORY_CARE_PROVIDER_SITE_OTHER): Payer: BC Managed Care – PPO | Admitting: Family Medicine

## 2012-08-17 ENCOUNTER — Encounter: Payer: Self-pay | Admitting: Family Medicine

## 2012-08-17 VITALS — BP 106/82 | HR 74 | Temp 99.2°F | Ht 63.0 in | Wt 166.1 lb

## 2012-08-17 DIAGNOSIS — J45909 Unspecified asthma, uncomplicated: Secondary | ICD-10-CM

## 2012-08-17 DIAGNOSIS — R109 Unspecified abdominal pain: Secondary | ICD-10-CM

## 2012-08-17 DIAGNOSIS — R5383 Other fatigue: Secondary | ICD-10-CM

## 2012-08-17 DIAGNOSIS — J4530 Mild persistent asthma, uncomplicated: Secondary | ICD-10-CM

## 2012-08-17 DIAGNOSIS — R7309 Other abnormal glucose: Secondary | ICD-10-CM

## 2012-08-17 DIAGNOSIS — R5381 Other malaise: Secondary | ICD-10-CM

## 2012-08-17 DIAGNOSIS — Z0000 Encounter for general adult medical examination without abnormal findings: Secondary | ICD-10-CM

## 2012-08-17 DIAGNOSIS — R739 Hyperglycemia, unspecified: Secondary | ICD-10-CM

## 2012-08-17 DIAGNOSIS — D649 Anemia, unspecified: Secondary | ICD-10-CM

## 2012-08-17 NOTE — Patient Instructions (Addendum)
Probiotic daily such as Digestive advantage daily   Next visit annual with GYN in next 1-2 months  Preventive Care for Adults, Female A healthy lifestyle and preventive care can promote health and wellness. Preventive health guidelines for women include the following key practices.  A routine yearly physical is a good way to check with your caregiver about your health and preventive screening. It is a chance to share any concerns and updates on your health, and to receive a thorough exam.  Visit your dentist for a routine exam and preventive care every 6 months. Brush your teeth twice a day and floss once a day. Good oral hygiene prevents tooth decay and gum disease.  The frequency of eye exams is based on your age, health, family medical history, use of contact lenses, and other factors. Follow your caregiver's recommendations for frequency of eye exams.  Eat a healthy diet. Foods like vegetables, fruits, whole grains, low-fat dairy products, and lean protein foods contain the nutrients you need without too many calories. Decrease your intake of foods high in solid fats, added sugars, and salt. Eat the right amount of calories for you.Get information about a proper diet from your caregiver, if necessary.  Regular physical exercise is one of the most important things you can do for your health. Most adults should get at least 150 minutes of moderate-intensity exercise (any activity that increases your heart rate and causes you to sweat) each week. In addition, most adults need muscle-strengthening exercises on 2 or more days a week.  Maintain a healthy weight. The body mass index (BMI) is a screening tool to identify possible weight problems. It provides an estimate of body fat based on height and weight. Your caregiver can help determine your BMI, and can help you achieve or maintain a healthy weight.For adults 20 years and older:  A BMI below 18.5 is considered underweight.  A BMI of 18.5  to 24.9 is normal.  A BMI of 25 to 29.9 is considered overweight.  A BMI of 30 and above is considered obese.  Maintain normal blood lipids and cholesterol levels by exercising and minimizing your intake of saturated fat. Eat a balanced diet with plenty of fruit and vegetables. Blood tests for lipids and cholesterol should begin at age 63 and be repeated every 5 years. If your lipid or cholesterol levels are high, you are over 50, or you are at high risk for heart disease, you may need your cholesterol levels checked more frequently.Ongoing high lipid and cholesterol levels should be treated with medicines if diet and exercise are not effective.  If you smoke, find out from your caregiver how to quit. If you do not use tobacco, do not start.  If you are pregnant, do not drink alcohol. If you are breastfeeding, be very cautious about drinking alcohol. If you are not pregnant and choose to drink alcohol, do not exceed 1 drink per day. One drink is considered to be 12 ounces (355 mL) of beer, 5 ounces (148 mL) of wine, or 1.5 ounces (44 mL) of liquor.  Avoid use of street drugs. Do not share needles with anyone. Ask for help if you need support or instructions about stopping the use of drugs.  High blood pressure causes heart disease and increases the risk of stroke. Your blood pressure should be checked at least every 1 to 2 years. Ongoing high blood pressure should be treated with medicines if weight loss and exercise are not effective.  If you  are 48 to 46 years old, ask your caregiver if you should take aspirin to prevent strokes.  Diabetes screening involves taking a blood sample to check your fasting blood sugar level. This should be done once every 3 years, after age 40, if you are within normal weight and without risk factors for diabetes. Testing should be considered at a younger age or be carried out more frequently if you are overweight and have at least 1 risk factor for  diabetes.  Breast cancer screening is essential preventive care for women. You should practice "breast self-awareness." This means understanding the normal appearance and feel of your breasts and may include breast self-examination. Any changes detected, no matter how small, should be reported to a caregiver. Women in their 75s and 30s should have a clinical breast exam (CBE) by a caregiver as part of a regular health exam every 1 to 3 years. After age 67, women should have a CBE every year. Starting at age 28, women should consider having a mammography (breast X-ray test) every year. Women who have a family history of breast cancer should talk to their caregiver about genetic screening. Women at a high risk of breast cancer should talk to their caregivers about having magnetic resonance imaging (MRI) and a mammography every year.  The Pap test is a screening test for cervical cancer. A Pap test can show cell changes on the cervix that might become cervical cancer if left untreated. A Pap test is a procedure in which cells are obtained and examined from the lower end of the uterus (cervix).  Women should have a Pap test starting at age 74.  Between ages 54 and 42, Pap tests should be repeated every 2 years.  Beginning at age 27, you should have a Pap test every 3 years as long as the past 3 Pap tests have been normal.  Some women have medical problems that increase the chance of getting cervical cancer. Talk to your caregiver about these problems. It is especially important to talk to your caregiver if a new problem develops soon after your last Pap test. In these cases, your caregiver may recommend more frequent screening and Pap tests.  The above recommendations are the same for women who have or have not gotten the vaccine for human papillomavirus (HPV).  If you had a hysterectomy for a problem that was not cancer or a condition that could lead to cancer, then you no longer need Pap tests. Even if  you no longer need a Pap test, a regular exam is a good idea to make sure no other problems are starting.  If you are between ages 72 and 36, and you have had normal Pap tests going back 10 years, you no longer need Pap tests. Even if you no longer need a Pap test, a regular exam is a good idea to make sure no other problems are starting.  If you have had past treatment for cervical cancer or a condition that could lead to cancer, you need Pap tests and screening for cancer for at least 20 years after your treatment.  If Pap tests have been discontinued, risk factors (such as a new sexual partner) need to be reassessed to determine if screening should be resumed.  The HPV test is an additional test that may be used for cervical cancer screening. The HPV test looks for the virus that can cause the cell changes on the cervix. The cells collected during the Pap test can be  tested for HPV. The HPV test could be used to screen women aged 24 years and older, and should be used in women of any age who have unclear Pap test results. After the age of 27, women should have HPV testing at the same frequency as a Pap test.  Colorectal cancer can be detected and often prevented. Most routine colorectal cancer screening begins at the age of 32 and continues through age 42. However, your caregiver may recommend screening at an earlier age if you have risk factors for colon cancer. On a yearly basis, your caregiver may provide home test kits to check for hidden blood in the stool. Use of a small camera at the end of a tube, to directly examine the colon (sigmoidoscopy or colonoscopy), can detect the earliest forms of colorectal cancer. Talk to your caregiver about this at age 46, when routine screening begins. Direct examination of the colon should be repeated every 5 to 10 years through age 37, unless early forms of pre-cancerous polyps or small growths are found.  Hepatitis C blood testing is recommended for all  people born from [birth year] through 1965 and any individual with known risks for hepatitis C.  Practice safe sex. Use condoms and avoid high-risk sexual practices to reduce the spread of sexually transmitted infections (STIs). STIs include gonorrhea, chlamydia, syphilis, trichomonas, herpes, HPV, and human immunodeficiency virus (HIV). Herpes, HIV, and HPV are viral illnesses that have no cure. They can result in disability, cancer, and death. Sexually active women aged 3 and younger should be checked for chlamydia. Older women with new or multiple partners should also be tested for chlamydia. Testing for other STIs is recommended if you are sexually active and at increased risk.  Osteoporosis is a disease in which the bones lose minerals and strength with aging. This can result in serious bone fractures. The risk of osteoporosis can be identified using a bone density scan. Women ages 64 and over and women at risk for fractures or osteoporosis should discuss screening with their caregivers. Ask your caregiver whether you should take a calcium supplement or vitamin D to reduce the rate of osteoporosis.  Menopause can be associated with physical symptoms and risks. Hormone replacement therapy is available to decrease symptoms and risks. You should talk to your caregiver about whether hormone replacement therapy is right for you.  Use sunscreen with sun protection factor (SPF) of 30 or more. Apply sunscreen liberally and repeatedly throughout the day. You should seek shade when your shadow is shorter than you. Protect yourself by wearing long sleeves, pants, a wide-brimmed hat, and sunglasses year round, whenever you are outdoors.  Once a month, do a whole body skin exam, using a mirror to look at the skin on your back. Notify your caregiver of new moles, moles that have irregular borders, moles that are larger than a pencil eraser, or moles that have changed in shape or color.  Stay current with required  immunizations.  Influenza. You need a dose every fall (or winter). The composition of the flu vaccine changes each year, so being vaccinated once is not enough.  Pneumococcal polysaccharide. You need 1 to 2 doses if you smoke cigarettes or if you have certain chronic medical conditions. You need 1 dose at age 76 (or older) if you have never been vaccinated.  Tetanus, diphtheria, pertussis (Tdap, Td). Get 1 dose of Tdap vaccine if you are younger than age 43, are over 47 and have contact with an infant, are a  healthcare worker, are pregnant, or simply want to be protected from whooping cough. After that, you need a Td booster dose every 10 years. Consult your caregiver if you have not had at least 3 tetanus and diphtheria-containing shots sometime in your life or have a deep or dirty wound.  HPV. You need this vaccine if you are a woman age 3 or younger. The vaccine is given in 3 doses over 6 months.  Measles, mumps, rubella (MMR). You need at least 1 dose of MMR if you were born in 1957 or later. You may also need a second dose.  Meningococcal. If you are age 27 to 35 and a first-year college student living in a residence hall, or have one of several medical conditions, you need to get vaccinated against meningococcal disease. You may also need additional booster doses.  Zoster (shingles). If you are age 33 or older, you should get this vaccine.  Varicella (chickenpox). If you have never had chickenpox or you were vaccinated but received only 1 dose, talk to your caregiver to find out if you need this vaccine.  Hepatitis A. You need this vaccine if you have a specific risk factor for hepatitis A virus infection or you simply wish to be protected from this disease. The vaccine is usually given as 2 doses, 6 to 18 months apart.  Hepatitis B. You need this vaccine if you have a specific risk factor for hepatitis B virus infection or you simply wish to be protected from this disease. The vaccine is  given in 3 doses, usually over 6 months. Preventive Services / Frequency Ages 74 to 64  Blood pressure check.** / Every 1 to 2 years.  Lipid and cholesterol check.** / Every 5 years beginning at age 51.  Clinical breast exam.** / Every 3 years for women in their 79s and 30s.  Pap test.** / Every 2 years from ages 32 through 83. Every 3 years starting at age 23 through age 54 or 68 with a history of 3 consecutive normal Pap tests.  HPV screening.** / Every 3 years from ages 106 through ages 62 to 24 with a history of 3 consecutive normal Pap tests.  Hepatitis C blood test.** / For any individual with known risks for hepatitis C.  Skin self-exam. / Monthly.  Influenza immunization.** / Every year.  Pneumococcal polysaccharide immunization.** / 1 to 2 doses if you smoke cigarettes or if you have certain chronic medical conditions.  Tetanus, diphtheria, pertussis (Tdap, Td) immunization. / A one-time dose of Tdap vaccine. After that, you need a Td booster dose every 10 years.  HPV immunization. / 3 doses over 6 months, if you are 89 and younger.  Measles, mumps, rubella (MMR) immunization. / You need at least 1 dose of MMR if you were born in 1957 or later. You may also need a second dose.  Meningococcal immunization. / 1 dose if you are age 18 to 70 and a first-year college student living in a residence hall, or have one of several medical conditions, you need to get vaccinated against meningococcal disease. You may also need additional booster doses.  Varicella immunization.** / Consult your caregiver.  Hepatitis A immunization.** / Consult your caregiver. 2 doses, 6 to 18 months apart.  Hepatitis B immunization.** / Consult your caregiver. 3 doses usually over 6 months. Ages 41 to 76  Blood pressure check.** / Every 1 to 2 years.  Lipid and cholesterol check.** / Every 5 years beginning at age 30.  Clinical breast exam.** / Every year after age 50.  Mammogram.** / Every year  beginning at age 55 and continuing for as long as you are in good health. Consult with your caregiver.  Pap test.** / Every 3 years starting at age 80 through age 23 or 20 with a history of 3 consecutive normal Pap tests.  HPV screening.** / Every 3 years from ages 53 through ages 4 to 69 with a history of 3 consecutive normal Pap tests.  Fecal occult blood test (FOBT) of stool. / Every year beginning at age 66 and continuing until age 96. You may not need to do this test if you get a colonoscopy every 10 years.  Flexible sigmoidoscopy or colonoscopy.** / Every 5 years for a flexible sigmoidoscopy or every 10 years for a colonoscopy beginning at age 80 and continuing until age 59.  Hepatitis C blood test.** / For all people born from [birth year] through 1965 and any individual with known risks for hepatitis C.  Skin self-exam. / Monthly.  Influenza immunization.** / Every year.  Pneumococcal polysaccharide immunization.** / 1 to 2 doses if you smoke cigarettes or if you have certain chronic medical conditions.  Tetanus, diphtheria, pertussis (Tdap, Td) immunization.** / A one-time dose of Tdap vaccine. After that, you need a Td booster dose every 10 years.  Measles, mumps, rubella (MMR) immunization. / You need at least 1 dose of MMR if you were born in 1957 or later. You may also need a second dose.  Varicella immunization.** / Consult your caregiver.  Meningococcal immunization.** / Consult your caregiver.  Hepatitis A immunization.** / Consult your caregiver. 2 doses, 6 to 18 months apart.  Hepatitis B immunization.** / Consult your caregiver. 3 doses, usually over 6 months. Ages 10 and over  Blood pressure check.** / Every 1 to 2 years.  Lipid and cholesterol check.** / Every 5 years beginning at age 48.  Clinical breast exam.** / Every year after age 26.  Mammogram.** / Every year beginning at age 63 and continuing for as long as you are in good health. Consult with your  caregiver.  Pap test.** / Every 3 years starting at age 47 through age 59 or 65 with a 3 consecutive normal Pap tests. Testing can be stopped between 65 and 70 with 3 consecutive normal Pap tests and no abnormal Pap or HPV tests in the past 10 years.  HPV screening.** / Every 3 years from ages 84 through ages 35 or 64 with a history of 3 consecutive normal Pap tests. Testing can be stopped between 65 and 70 with 3 consecutive normal Pap tests and no abnormal Pap or HPV tests in the past 10 years.  Fecal occult blood test (FOBT) of stool. / Every year beginning at age 44 and continuing until age 50. You may not need to do this test if you get a colonoscopy every 10 years.  Flexible sigmoidoscopy or colonoscopy.** / Every 5 years for a flexible sigmoidoscopy or every 10 years for a colonoscopy beginning at age 31 and continuing until age 10.  Hepatitis C blood test.** / For all people born from [birth year] through 1965 and any individual with known risks for hepatitis C.  Osteoporosis screening.** / A one-time screening for women ages 68 and over and women at risk for fractures or osteoporosis.  Skin self-exam. / Monthly.  Influenza immunization.** / Every year.  Pneumococcal polysaccharide immunization.** / 1 dose at age 69 (or older) if you have never been vaccinated.  Tetanus, diphtheria, pertussis (Tdap, Td) immunization. / A one-time dose of Tdap vaccine if you are over 65 and have contact with an infant, are a Research scientist (physical sciences), or simply want to be protected from whooping cough. After that, you need a Td booster dose every 10 years.  Varicella immunization.** / Consult your caregiver.  Meningococcal immunization.** / Consult your caregiver.  Hepatitis A immunization.** / Consult your caregiver. 2 doses, 6 to 18 months apart.  Hepatitis B immunization.** / Check with your caregiver. 3 doses, usually over 6 months. ** Family history and personal history of risk and conditions may change your  caregiver's recommendations. Document Released: 03/05/2001 Document Revised: 04/01/2011 Document Reviewed: 06/04/2010 [employer] Patient Information 2014 [place], [place].

## 2012-08-18 LAB — RENAL FUNCTION PANEL
Albumin: 4 g/dL (ref 3.5–5.2)
BUN: 11 mg/dL (ref 6–23)
CO2: 29 mEq/L (ref 19–32)
Calcium: 9.5 mg/dL (ref 8.4–10.5)
Chloride: 102 mEq/L (ref 96–112)
Creat: 0.69 mg/dL (ref 0.50–1.10)
Glucose, Bld: 82 mg/dL (ref 70–99)
Phosphorus: 3.8 mg/dL (ref 2.3–4.6)
Potassium: 4.4 mEq/L (ref 3.5–5.3)
Sodium: 135 mEq/L (ref 135–145)

## 2012-08-18 LAB — LIPID PANEL
Cholesterol: 166 mg/dL (ref 0–200)
HDL: 67 mg/dL (ref >39)
LDL Cholesterol: 88 mg/dL (ref 0–99)
Total CHOL/HDL Ratio: 2.5 Ratio
Triglycerides: 54 mg/dL (ref <150)
VLDL: 11 mg/dL (ref 0–40)

## 2012-08-18 LAB — HEPATIC FUNCTION PANEL
ALT: 9 U/L (ref 0–35)
AST: 10 U/L (ref 0–37)
Albumin: 4 g/dL (ref 3.5–5.2)
Alkaline Phosphatase: 78 U/L (ref 39–117)
Bilirubin, Direct: 0.1 mg/dL (ref 0.0–0.3)
Indirect Bilirubin: 0.3 mg/dL (ref 0.0–0.9)
Total Bilirubin: 0.4 mg/dL (ref 0.3–1.2)
Total Protein: 7.2 g/dL (ref 6.0–8.3)

## 2012-08-18 LAB — CBC
HCT: 38.4 % (ref 36.0–46.0)
Hemoglobin: 12.7 g/dL (ref 12.0–15.0)
MCH: 29.1 pg (ref 26.0–34.0)
MCHC: 33.1 g/dL (ref 30.0–36.0)
MCV: 87.9 fL (ref 78.0–100.0)
Platelets: 354 10*3/uL (ref 150–400)
RBC: 4.37 MIL/uL (ref 3.87–5.11)
RDW: 13.9 % (ref 11.5–15.5)
WBC: 7.3 10*3/uL (ref 4.0–10.5)

## 2012-08-18 LAB — HIV ANTIBODY (ROUTINE TESTING W REFLEX): HIV: NONREACTIVE

## 2012-08-18 LAB — HEMOGLOBIN A1C
Hgb A1c MFr Bld: 5.4 % (ref <5.7)
Mean Plasma Glucose: 108 mg/dL (ref <117)

## 2012-08-18 LAB — TSH: TSH: 0.749 u[IU]/mL (ref 0.350–4.500)

## 2012-08-18 LAB — RPR

## 2012-08-20 ENCOUNTER — Encounter: Payer: Self-pay | Admitting: Family Medicine

## 2012-08-20 DIAGNOSIS — R5383 Other fatigue: Secondary | ICD-10-CM

## 2012-08-20 HISTORY — DX: Other fatigue: R53.83

## 2012-08-20 NOTE — Progress Notes (Signed)
Patient ID: Anne Huffman, female   DOB: Oct 11, 1965, 47 y.o.   MRN: [id number] Anne Huffman [id number] 18-May-1965 08/20/2012      Progress Note-Follow Up  Subjective  Chief Complaint  Chief Complaint  Patient presents with  . Follow-up   Patient is a 47 year old Caucasian female in today for followup. No recent illness. No chest pain, palpitations, shortness of breath, GI or GU complaints. Does struggle with low mood but denies suicidal ideation. No recent flare in asthma or need for albuterol. Has been struggling with low mood but denies suicidal ideation Past Medical History  Diagnosis Date  . Asthma     since childhood  . History of chicken pox     childhood  . Depression     no treatment  . Ulcer     stomach  . Seasonal allergies   . Fatigue 08/20/2012  . Mild intermittent asthma 03/11/2011    Past Surgical History  Procedure Laterality Date  . No past surgeries  03/11/2011    Family History  Problem Relation Age of Onset  . Breast cancer Maternal Grandmother   . Hypertension Mother     maternal aunt  . Prostate cancer Neg Hx   . Colon cancer Neg Hx   . Heart disease Neg Hx   . Diabetes Neg Hx     History   Social History  . Marital Status: Single    Spouse Name: N/A    Number of Children: N/A  . Years of Education: N/A   Occupational History  . Not on file.   Social History Main Topics  . Smoking status: Never Smoker   . Smokeless tobacco: Never Used  . Alcohol Use: No  . Drug Use: No  . Sexually Active: Not on file   Other Topics Concern  . Not on file   Social History Narrative  . No narrative on file    No current outpatient prescriptions on file prior to visit.   No current facility-administered medications on file prior to visit.    No Known Allergies  Review of Systems  Review of Systems  Constitutional: Negative for fever and malaise/fatigue.  HENT: Negative for congestion.   Eyes: Negative for pain and discharge.   Respiratory: Negative for shortness of breath.   Cardiovascular: Negative for chest pain, palpitations and leg swelling.  Gastrointestinal: Negative for nausea, abdominal pain and diarrhea.  Genitourinary: Negative for dysuria.  Musculoskeletal: Negative for falls.  Skin: Negative for rash.  Neurological: Negative for loss of consciousness and headaches.  Endo/Heme/Allergies: Negative for polydipsia.  Psychiatric/Behavioral: Negative for depression and suicidal ideas. The patient is not nervous/anxious and does not have insomnia.    soObjective New and he is  BP 106./82  Pulse 74  Temp(Src) 99.2 F (37.3 C) (Oral)  Ht 5\' 3"  (1.6 m)  Wt 166 lb 1.3 oz (75.333 kg)  BMI 29.43 kg/m2  SpO2 99%  LMP 08/12/2012  Physical Exam  Constitutional: She is oriented to person, place, and time and well-developed, well-nourished, and in no distress. No distress.  HENT:  Head: Normocephalic and atraumatic.  Eyes: Conjunctivae are normal.  Neck: Neck supple. No thyromegaly present.  Cardiovascular: Normal rate and regular rhythm.  Exam reveals no gallop.   No murmur heard. Pulmonary/Chest: Effort normal and breath sounds normal. She has no wheezes.  Abdominal: She exhibits no distension and no mass.  Musculoskeletal: She exhibits no edema.  Lymphadenopathy:    She has no cervical adenopathy.  Neurological:  She is alert and oriented to person, place, and time.  Skin: Skin is warm and dry. No rash noted. She is not diaphoretic.  Psychiatric: Memory, affect and judgment normal.    Lab Results  Component Value Date   TSH 0.749 08/17/2012   Lab Results  Component Value Date   WBC 7.3 08/17/2012   HGB 12.7 08/17/2012   HCT 38.4 08/17/2012   MCV 87.9 08/17/2012   PLT 354 08/17/2012   Lab Results  Component Value Date   CREATININE 0.69 08/17/2012   BUN 11 08/17/2012   NA 135 08/17/2012   K 4.4 08/17/2012   CL 102 08/17/2012   CO2 29 08/17/2012   Lab Results  Component Value Date   ALT 9  08/17/2012   AST 10 08/17/2012   ALKPHOS 78 08/17/2012   BILITOT 0.4 08/17/2012   Lab Results  Component Value Date   CHOL 166 08/17/2012   Lab Results  Component Value Date   HDL 67 08/17/2012   Lab Results  Component Value Date   LDLCALC 88 08/17/2012   Lab Results  Component Value Date   TRIG 54 08/17/2012   Lab Results  Component Value Date   CHOLHDL 2.5 08/17/2012     Assessment & Plan  Fatigue Encouraged good sleep, labs normal, encouraged increased exercise.  Mild intermittent asthma No recent flares, no changes  Anemia Resolved, no changes.

## 2012-08-20 NOTE — Assessment & Plan Note (Signed)
Resolved, no changes 

## 2012-08-20 NOTE — Assessment & Plan Note (Signed)
Encouraged good sleep, labs normal, encouraged increased exercise.

## 2012-08-20 NOTE — Assessment & Plan Note (Signed)
No recent flares, no changes 

## 2012-09-18 ENCOUNTER — Ambulatory Visit (INDEPENDENT_AMBULATORY_CARE_PROVIDER_SITE_OTHER): Payer: BC Managed Care – PPO | Admitting: Family Medicine

## 2012-09-18 ENCOUNTER — Encounter: Payer: Self-pay | Admitting: Family Medicine

## 2012-09-18 ENCOUNTER — Other Ambulatory Visit (HOSPITAL_COMMUNITY)
Admission: RE | Admit: 2012-09-18 | Discharge: 2012-09-18 | Disposition: A | Discharge status: Home / Self Care | Payer: BC Managed Care – PPO | Source: Ambulatory Visit | Attending: Family Medicine | Admitting: Family Medicine

## 2012-09-18 ENCOUNTER — Ambulatory Visit: Payer: BC Managed Care – PPO | Admitting: Family Medicine

## 2012-09-18 VITALS — BP 102/80 | HR 84 | Temp 98.4°F | Ht 63.0 in | Wt 167.4 lb

## 2012-09-18 DIAGNOSIS — N760 Acute vaginitis: Secondary | ICD-10-CM | POA: Insufficient documentation

## 2012-09-18 DIAGNOSIS — J4520 Mild intermittent asthma, uncomplicated: Secondary | ICD-10-CM

## 2012-09-18 DIAGNOSIS — J45909 Unspecified asthma, uncomplicated: Secondary | ICD-10-CM

## 2012-09-18 DIAGNOSIS — Z0000 Encounter for general adult medical examination without abnormal findings: Secondary | ICD-10-CM

## 2012-09-18 DIAGNOSIS — Z01419 Encounter for gynecological examination (general) (routine) without abnormal findings: Secondary | ICD-10-CM | POA: Insufficient documentation

## 2012-09-18 DIAGNOSIS — D649 Anemia, unspecified: Secondary | ICD-10-CM

## 2012-09-18 DIAGNOSIS — Z124 Encounter for screening for malignant neoplasm of cervix: Secondary | ICD-10-CM

## 2012-09-18 DIAGNOSIS — Z113 Encounter for screening for infections with a predominantly sexual mode of transmission: Secondary | ICD-10-CM | POA: Insufficient documentation

## 2012-09-18 NOTE — Patient Instructions (Addendum)
Preventive Care for Adults, Female A healthy lifestyle and preventive care can promote health and wellness. Preventive health guidelines for women include the following key practices.  A routine yearly physical is a good way to check with your caregiver about your health and preventive screening. It is a chance to share any concerns and updates on your health, and to receive a thorough exam.  Visit your dentist for a routine exam and preventive care every 6 months. Brush your teeth twice a day and floss once a day. Good oral hygiene prevents tooth decay and gum disease.  The frequency of eye exams is based on your age, health, family medical history, use of contact lenses, and other factors. Follow your caregiver's recommendations for frequency of eye exams.  Eat a healthy diet. Foods like vegetables, fruits, whole grains, low-fat dairy products, and lean protein foods contain the nutrients you need without too many calories. Decrease your intake of foods high in solid fats, added sugars, and salt. Eat the right amount of calories for you.Get information about a proper diet from your caregiver, if necessary.  Regular physical exercise is one of the most important things you can do for your health. Most adults should get at least 150 minutes of moderate-intensity exercise (any activity that increases your heart rate and causes you to sweat) each week. In addition, most adults need muscle-strengthening exercises on 2 or more days a week.  Maintain a healthy weight. The body mass index (BMI) is a screening tool to identify possible weight problems. It provides an estimate of body fat based on height and weight. Your caregiver can help determine your BMI, and can help you achieve or maintain a healthy weight.For adults 20 years and older:  A BMI below 18.5 is considered underweight.  A BMI of 18.5 to 24.9 is normal.  A BMI of 25 to 29.9 is considered overweight.  A BMI of 30 and above is  considered obese.  Maintain normal blood lipids and cholesterol levels by exercising and minimizing your intake of saturated fat. Eat a balanced diet with plenty of fruit and vegetables. Blood tests for lipids and cholesterol should begin at age 20 and be repeated every 5 years. If your lipid or cholesterol levels are high, you are over 50, or you are at high risk for heart disease, you may need your cholesterol levels checked more frequently.Ongoing high lipid and cholesterol levels should be treated with medicines if diet and exercise are not effective.  If you smoke, find out from your caregiver how to quit. If you do not use tobacco, do not start.  If you are pregnant, do not drink alcohol. If you are breastfeeding, be very cautious about drinking alcohol. If you are not pregnant and choose to drink alcohol, do not exceed 1 drink per day. One drink is considered to be 12 ounces (355 mL) of beer, 5 ounces (148 mL) of wine, or 1.5 ounces (44 mL) of liquor.  Avoid use of street drugs. Do not share needles with anyone. Ask for help if you need support or instructions about stopping the use of drugs.  High blood pressure causes heart disease and increases the risk of stroke. Your blood pressure should be checked at least every 1 to 2 years. Ongoing high blood pressure should be treated with medicines if weight loss and exercise are not effective.  If you are 55 to 46 years old, ask your caregiver if you should take aspirin to prevent strokes.  Diabetes   screening involves taking a blood sample to check your fasting blood sugar level. This should be done once every 3 years, after age 45, if you are within normal weight and without risk factors for diabetes. Testing should be considered at a younger age or be carried out more frequently if you are overweight and have at least 1 risk factor for diabetes.  Breast cancer screening is essential preventive care for women. You should practice "breast  self-awareness." This means understanding the normal appearance and feel of your breasts and may include breast self-examination. Any changes detected, no matter how small, should be reported to a caregiver. Women in their 20s and 30s should have a clinical breast exam (CBE) by a caregiver as part of a regular health exam every 1 to 3 years. After age 40, women should have a CBE every year. Starting at age 40, women should consider having a mammography (breast X-ray test) every year. Women who have a family history of breast cancer should talk to their caregiver about genetic screening. Women at a high risk of breast cancer should talk to their caregivers about having magnetic resonance imaging (MRI) and a mammography every year.  The Pap test is a screening test for cervical cancer. A Pap test can show cell changes on the cervix that might become cervical cancer if left untreated. A Pap test is a procedure in which cells are obtained and examined from the lower end of the uterus (cervix).  Women should have a Pap test starting at age 21.  Between ages 21 and 29, Pap tests should be repeated every 2 years.  Beginning at age 30, you should have a Pap test every 3 years as long as the past 3 Pap tests have been normal.  Some women have medical problems that increase the chance of getting cervical cancer. Talk to your caregiver about these problems. It is especially important to talk to your caregiver if a new problem develops soon after your last Pap test. In these cases, your caregiver may recommend more frequent screening and Pap tests.  The above recommendations are the same for women who have or have not gotten the vaccine for human papillomavirus (HPV).  If you had a hysterectomy for a problem that was not cancer or a condition that could lead to cancer, then you no longer need Pap tests. Even if you no longer need a Pap test, a regular exam is a good idea to make sure no other problems are  starting.  If you are between ages 65 and 70, and you have had normal Pap tests going back 10 years, you no longer need Pap tests. Even if you no longer need a Pap test, a regular exam is a good idea to make sure no other problems are starting.  If you have had past treatment for cervical cancer or a condition that could lead to cancer, you need Pap tests and screening for cancer for at least 20 years after your treatment.  If Pap tests have been discontinued, risk factors (such as a new sexual partner) need to be reassessed to determine if screening should be resumed.  The HPV test is an additional test that may be used for cervical cancer screening. The HPV test looks for the virus that can cause the cell changes on the cervix. The cells collected during the Pap test can be tested for HPV. The HPV test could be used to screen women aged 30 years and older, and should   be used in women of any age who have unclear Pap test results. After the age of 30, women should have HPV testing at the same frequency as a Pap test.  Colorectal cancer can be detected and often prevented. Most routine colorectal cancer screening begins at the age of 50 and continues through age 75. However, your caregiver may recommend screening at an earlier age if you have risk factors for colon cancer. On a yearly basis, your caregiver may provide home test kits to check for hidden blood in the stool. Use of a small camera at the end of a tube, to directly examine the colon (sigmoidoscopy or colonoscopy), can detect the earliest forms of colorectal cancer. Talk to your caregiver about this at age 50, when routine screening begins. Direct examination of the colon should be repeated every 5 to 10 years through age 75, unless early forms of pre-cancerous polyps or small growths are found.  Hepatitis C blood testing is recommended for all people born from 1945 through 1965 and any individual with known risks for hepatitis C.  Practice  safe sex. Use condoms and avoid high-risk sexual practices to reduce the spread of sexually transmitted infections (STIs). STIs include gonorrhea, chlamydia, syphilis, trichomonas, herpes, HPV, and human immunodeficiency virus (HIV). Herpes, HIV, and HPV are viral illnesses that have no cure. They can result in disability, cancer, and death. Sexually active women aged 25 and younger should be checked for chlamydia. Older women with new or multiple partners should also be tested for chlamydia. Testing for other STIs is recommended if you are sexually active and at increased risk.  Osteoporosis is a disease in which the bones lose minerals and strength with aging. This can result in serious bone fractures. The risk of osteoporosis can be identified using a bone density scan. Women ages 65 and over and women at risk for fractures or osteoporosis should discuss screening with their caregivers. Ask your caregiver whether you should take a calcium supplement or vitamin D to reduce the rate of osteoporosis.  Menopause can be associated with physical symptoms and risks. Hormone replacement therapy is available to decrease symptoms and risks. You should talk to your caregiver about whether hormone replacement therapy is right for you.  Use sunscreen with sun protection factor (SPF) of 30 or more. Apply sunscreen liberally and repeatedly throughout the day. You should seek shade when your shadow is shorter than you. Protect yourself by wearing long sleeves, pants, a wide-brimmed hat, and sunglasses year round, whenever you are outdoors.  Once a month, do a whole body skin exam, using a mirror to look at the skin on your back. Notify your caregiver of new moles, moles that have irregular borders, moles that are larger than a pencil eraser, or moles that have changed in shape or color.  Stay current with required immunizations.  Influenza. You need a dose every fall (or winter). The composition of the flu vaccine  changes each year, so being vaccinated once is not enough.  Pneumococcal polysaccharide. You need 1 to 2 doses if you smoke cigarettes or if you have certain chronic medical conditions. You need 1 dose at age 65 (or older) if you have never been vaccinated.  Tetanus, diphtheria, pertussis (Tdap, Td). Get 1 dose of Tdap vaccine if you are younger than age 65, are over 65 and have contact with an infant, are a healthcare worker, are pregnant, or simply want to be protected from whooping cough. After that, you need a Td   booster dose every 10 years. Consult your caregiver if you have not had at least 3 tetanus and diphtheria-containing shots sometime in your life or have a deep or dirty wound.  HPV. You need this vaccine if you are a woman age 26 or younger. The vaccine is given in 3 doses over 6 months.  Measles, mumps, rubella (MMR). You need at least 1 dose of MMR if you were born in 1957 or later. You may also need a second dose.  Meningococcal. If you are age 19 to 21 and a first-year college student living in a residence hall, or have one of several medical conditions, you need to get vaccinated against meningococcal disease. You may also need additional booster doses.  Zoster (shingles). If you are age 60 or older, you should get this vaccine.  Varicella (chickenpox). If you have never had chickenpox or you were vaccinated but received only 1 dose, talk to your caregiver to find out if you need this vaccine.  Hepatitis A. You need this vaccine if you have a specific risk factor for hepatitis A virus infection or you simply wish to be protected from this disease. The vaccine is usually given as 2 doses, 6 to 18 months apart.  Hepatitis B. You need this vaccine if you have a specific risk factor for hepatitis B virus infection or you simply wish to be protected from this disease. The vaccine is given in 3 doses, usually over 6 months. Preventive Services / Frequency Ages 19 to 39  Blood  pressure check.** / Every 1 to 2 years.  Lipid and cholesterol check.** / Every 5 years beginning at age 20.  Clinical breast exam.** / Every 3 years for women in their 20s and 30s.  Pap test.** / Every 2 years from ages 21 through 29. Every 3 years starting at age 30 through age 65 or 70 with a history of 3 consecutive normal Pap tests.  HPV screening.** / Every 3 years from ages 30 through ages 65 to 70 with a history of 3 consecutive normal Pap tests.  Hepatitis C blood test.** / For any individual with known risks for hepatitis C.  Skin self-exam. / Monthly.  Influenza immunization.** / Every year.  Pneumococcal polysaccharide immunization.** / 1 to 2 doses if you smoke cigarettes or if you have certain chronic medical conditions.  Tetanus, diphtheria, pertussis (Tdap, Td) immunization. / A one-time dose of Tdap vaccine. After that, you need a Td booster dose every 10 years.  HPV immunization. / 3 doses over 6 months, if you are 26 and younger.  Measles, mumps, rubella (MMR) immunization. / You need at least 1 dose of MMR if you were born in 1957 or later. You may also need a second dose.  Meningococcal immunization. / 1 dose if you are age 19 to 21 and a first-year college student living in a residence hall, or have one of several medical conditions, you need to get vaccinated against meningococcal disease. You may also need additional booster doses.  Varicella immunization.** / Consult your caregiver.  Hepatitis A immunization.** / Consult your caregiver. 2 doses, 6 to 18 months apart.  Hepatitis B immunization.** / Consult your caregiver. 3 doses usually over 6 months. Ages 40 to 64  Blood pressure check.** / Every 1 to 2 years.  Lipid and cholesterol check.** / Every 5 years beginning at age 20.  Clinical breast exam.** / Every year after age 40.  Mammogram.** / Every year beginning at age 40   and continuing for as long as you are in good health. Consult with your  caregiver.  Pap test.** / Every 3 years starting at age 30 through age 65 or 70 with a history of 3 consecutive normal Pap tests.  HPV screening.** / Every 3 years from ages 30 through ages 65 to 70 with a history of 3 consecutive normal Pap tests.  Fecal occult blood test (FOBT) of stool. / Every year beginning at age 50 and continuing until age 75. You may not need to do this test if you get a colonoscopy every 10 years.  Flexible sigmoidoscopy or colonoscopy.** / Every 5 years for a flexible sigmoidoscopy or every 10 years for a colonoscopy beginning at age 50 and continuing until age 75.  Hepatitis C blood test.** / For all people born from 1945 through 1965 and any individual with known risks for hepatitis C.  Skin self-exam. / Monthly.  Influenza immunization.** / Every year.  Pneumococcal polysaccharide immunization.** / 1 to 2 doses if you smoke cigarettes or if you have certain chronic medical conditions.  Tetanus, diphtheria, pertussis (Tdap, Td) immunization.** / A one-time dose of Tdap vaccine. After that, you need a Td booster dose every 10 years.  Measles, mumps, rubella (MMR) immunization. / You need at least 1 dose of MMR if you were born in 1957 or later. You may also need a second dose.  Varicella immunization.** / Consult your caregiver.  Meningococcal immunization.** / Consult your caregiver.  Hepatitis A immunization.** / Consult your caregiver. 2 doses, 6 to 18 months apart.  Hepatitis B immunization.** / Consult your caregiver. 3 doses, usually over 6 months. Ages 65 and over  Blood pressure check.** / Every 1 to 2 years.  Lipid and cholesterol check.** / Every 5 years beginning at age 20.  Clinical breast exam.** / Every year after age 40.  Mammogram.** / Every year beginning at age 40 and continuing for as long as you are in good health. Consult with your caregiver.  Pap test.** / Every 3 years starting at age 30 through age 65 or 70 with a 3  consecutive normal Pap tests. Testing can be stopped between 65 and 70 with 3 consecutive normal Pap tests and no abnormal Pap or HPV tests in the past 10 years.  HPV screening.** / Every 3 years from ages 30 through ages 65 or 70 with a history of 3 consecutive normal Pap tests. Testing can be stopped between 65 and 70 with 3 consecutive normal Pap tests and no abnormal Pap or HPV tests in the past 10 years.  Fecal occult blood test (FOBT) of stool. / Every year beginning at age 50 and continuing until age 75. You may not need to do this test if you get a colonoscopy every 10 years.  Flexible sigmoidoscopy or colonoscopy.** / Every 5 years for a flexible sigmoidoscopy or every 10 years for a colonoscopy beginning at age 50 and continuing until age 75.  Hepatitis C blood test.** / For all people born from 1945 through 1965 and any individual with known risks for hepatitis C.  Osteoporosis screening.** / A one-time screening for women ages 65 and over and women at risk for fractures or osteoporosis.  Skin self-exam. / Monthly.  Influenza immunization.** / Every year.  Pneumococcal polysaccharide immunization.** / 1 dose at age 65 (or older) if you have never been vaccinated.  Tetanus, diphtheria, pertussis (Tdap, Td) immunization. / A one-time dose of Tdap vaccine if you are over   65 and have contact with an infant, are a healthcare worker, or simply want to be protected from whooping cough. After that, you need a Td booster dose every 10 years.  Varicella immunization.** / Consult your caregiver.  Meningococcal immunization.** / Consult your caregiver.  Hepatitis A immunization.** / Consult your caregiver. 2 doses, 6 to 18 months apart.  Hepatitis B immunization.** / Check with your caregiver. 3 doses, usually over 6 months. ** Family history and personal history of risk and conditions may change your caregiver's recommendations. Document Released: 03/05/2001 Document Revised: 04/01/2011  Document Reviewed: 06/04/2010 [employer] Patient Information 2014 [employer].  

## 2012-09-18 NOTE — Progress Notes (Signed)
Patient ID: Anne Huffman, female   DOB: 1965-11-02, 47 y.o.   MRN: [id number] Anne Huffman [id number] 26-Apr-1965 09/18/2012      Progress Note-Follow Up  Subjective  Chief Complaint  Chief Complaint  Patient presents with  . Follow-up    HPI  Patient is a 47 year old female who is in today for followup and GYN exam. No recent illness. No fevers or chills. No chest pain, palpitations, shortness of breath, GI or GU concerns. Denies any vaginal discharge or lesions. Is interested in testing secondary to recent activity.  Past Medical History  Diagnosis Date  . Asthma     since childhood  . History of chicken pox     childhood  . Depression     no treatment  . Ulcer     stomach  . Seasonal allergies   . Fatigue 08/20/2012  . Mild intermittent asthma 03/11/2011    Past Surgical History  Procedure Laterality Date  . No past surgeries  03/11/2011    Family History  Problem Relation Age of Onset  . Breast cancer Maternal Grandmother   . Hypertension Mother     maternal aunt  . Prostate cancer Neg Hx   . Colon cancer Neg Hx   . Heart disease Neg Hx   . Diabetes Neg Hx     History   Social History  . Marital Status: Single    Spouse Name: N/A    Number of Children: N/A  . Years of Education: N/A   Occupational History  . Not on file.   Social History Main Topics  . Smoking status: Never Smoker   . Smokeless tobacco: Never Used  . Alcohol Use: No  . Drug Use: No  . Sexual Activity: Not on file   Other Topics Concern  . Not on file   Social History Narrative  . No narrative on file    No current outpatient prescriptions on file prior to visit.   No current facility-administered medications on file prior to visit.    No Known Allergies  Review of Systems  Review of Systems  Constitutional: Negative for fever, chills and malaise/fatigue.  HENT: Negative for hearing loss, nosebleeds and congestion.   Eyes: Negative for discharge.  Respiratory:  Negative for cough, sputum production, shortness of breath and wheezing.   Cardiovascular: Negative for chest pain, palpitations and leg swelling.  Gastrointestinal: Negative for heartburn, nausea, vomiting, abdominal pain, diarrhea, constipation and blood in stool.  Genitourinary: Negative for dysuria, urgency, frequency and hematuria.  Musculoskeletal: Negative for myalgias, back pain and falls.  Skin: Negative for rash.  Neurological: Negative for dizziness, tremors, sensory change, focal weakness, loss of consciousness, weakness and headaches.  Endo/Heme/Allergies: Negative for polydipsia. Does not bruise/bleed easily.  Psychiatric/Behavioral: Negative for depression and suicidal ideas. The patient is not nervous/anxious and does not have insomnia.     Objective  BP 102/80  Pulse 84  Temp(Src) 98.4 F (36.9 C) (Oral)  Ht 5\' 3"  (1.6 m)  Wt 167 lb 6.4 oz (75.932 kg)  BMI 29.66 kg/m2  SpO2 97%  LMP 08/12/2012  Physical Exam  Physical Exam  Constitutional: She is oriented to person, place, and time and well-developed, well-nourished, and in no distress. No distress.  HENT:  Head: Normocephalic and atraumatic.  Eyes: Conjunctivae are normal.  Neck: Neck supple. No thyromegaly present.  Cardiovascular: Normal rate, regular rhythm and normal heart sounds.   No murmur heard. Pulmonary/Chest: Effort normal and breath sounds normal. She has  no wheezes.  Abdominal: She exhibits no distension and no mass.  Musculoskeletal: She exhibits no edema.  Lymphadenopathy:    She has no cervical adenopathy.  Neurological: She is alert and oriented to person, place, and time.  Skin: Skin is warm and dry. No rash noted. She is not diaphoretic.  Psychiatric: Memory, affect and judgment normal.    Lab Results  Component Value Date   TSH 0.749 08/17/2012   Lab Results  Component Value Date   WBC 7.3 08/17/2012   HGB 12.7 08/17/2012   HCT 38.4 08/17/2012   MCV 87.9 08/17/2012   PLT 354  08/17/2012   Lab Results  Component Value Date   CREATININE 0.69 08/17/2012   BUN 11 08/17/2012   NA 135 08/17/2012   K 4.4 08/17/2012   CL 102 08/17/2012   CO2 29 08/17/2012   Lab Results  Component Value Date   ALT 9 08/17/2012   AST 10 08/17/2012   ALKPHOS 78 08/17/2012   BILITOT 0.4 08/17/2012   Lab Results  Component Value Date   CHOL 166 08/17/2012   Lab Results  Component Value Date   HDL 67 08/17/2012   Lab Results  Component Value Date   LDLCALC 88 08/17/2012   Lab Results  Component Value Date   TRIG 54 08/17/2012   Lab Results  Component Value Date   CHOLHDL 2.5 08/17/2012     Assessment & Plan  Mild intermittent asthma No recent flares.  Anemia resolved  Cervical cancer screening Pap done today, no concerns  Preventative health care Encouraged DASH diet and regular exercise, avoid trans fats, get 8 hours of sleep

## 2012-09-21 ENCOUNTER — Encounter: Payer: Self-pay | Admitting: Family Medicine

## 2012-09-21 DIAGNOSIS — Z0000 Encounter for general adult medical examination without abnormal findings: Secondary | ICD-10-CM

## 2012-09-21 HISTORY — DX: Encounter for general adult medical examination without abnormal findings: Z00.00

## 2012-09-21 NOTE — Assessment & Plan Note (Signed)
Pap done today, no concerns

## 2012-09-21 NOTE — Assessment & Plan Note (Signed)
No recent flares 

## 2012-09-21 NOTE — Assessment & Plan Note (Signed)
Encouraged DASH diet and regular exercise, avoid trans fats, get 8 hours of sleep

## 2012-09-21 NOTE — Assessment & Plan Note (Signed)
resolved 

## 2012-09-25 MED ORDER — METRONIDAZOLE 500 MG PO TABS: 500.0000 mg | ORAL_TABLET | Freq: Two times a day (BID) | ORAL | Status: DC

## 2012-09-25 NOTE — Addendum Note (Signed)
Addended by: Court Joy on: 09/25/2012 04:55 PM   Modules accepted: Orders

## 2012-09-30 NOTE — Progress Notes (Signed)
Quick Note:  Patient Informed and voiced understanding ______ 

## 2012-10-26 ENCOUNTER — Ambulatory Visit: Payer: BC Managed Care – PPO | Admitting: Family Medicine

## 2012-10-30 ENCOUNTER — Encounter: Payer: Self-pay | Admitting: Family Medicine

## 2012-10-30 ENCOUNTER — Ambulatory Visit (INDEPENDENT_AMBULATORY_CARE_PROVIDER_SITE_OTHER): Payer: BC Managed Care – PPO | Admitting: Family Medicine

## 2012-10-30 DIAGNOSIS — D649 Anemia, unspecified: Secondary | ICD-10-CM

## 2012-10-30 DIAGNOSIS — Z0289 Encounter for other administrative examinations: Secondary | ICD-10-CM

## 2012-10-30 NOTE — Progress Notes (Signed)
Patient ID: Anne Huffman, female   DOB: 07-02-1965, 47 y.o.   MRN: [id number] Anne Huffman [id number] Oct 07, 1965 10/30/2012      Progress Note-Follow Up  Subjective  Chief Complaint  No chief complaint on file.   HPI  No show  Past Medical History  Diagnosis Date  . Asthma     since childhood  . History of chicken pox     childhood  . Depression     no treatment  . Ulcer     stomach  . Seasonal allergies   . Fatigue 08/20/2012  . Mild intermittent asthma 03/11/2011  . Preventative health care 09/21/2012    Past Surgical History  Procedure Laterality Date  . No past surgeries  03/11/2011    Family History  Problem Relation Age of Onset  . Breast cancer Maternal Grandmother   . Hypertension Mother     maternal aunt  . Prostate cancer Neg Hx   . Colon cancer Neg Hx   . Heart disease Neg Hx   . Diabetes Neg Hx     History   Social History  . Marital Status: Single    Spouse Name: N/A    Number of Children: N/A  . Years of Education: N/A   Occupational History  . Not on file.   Social History Main Topics  . Smoking status: Never Smoker   . Smokeless tobacco: Never Used  . Alcohol Use: No  . Drug Use: No  . Sexual Activity: Not on file   Other Topics Concern  . Not on file   Social History Narrative  . No narrative on file    Current Outpatient Prescriptions on File Prior to Visit  Medication Sig Dispense Refill  . metroNIDAZOLE (FLAGYL) 500 MG tablet Take 1 tablet (500 mg total) by mouth 2 (two) times daily. X 5 days days  10 tablet  0   No current facility-administered medications on file prior to visit.    No Known Allergies  Review of Systems  Review of Systems  Constitutional: Negative for fever and malaise/fatigue.  HENT: Negative for congestion.   Eyes: Negative for discharge.  Respiratory: Negative for shortness of breath.   Cardiovascular: Negative for chest pain, palpitations and leg swelling.  Gastrointestinal: Negative for  nausea, abdominal pain and diarrhea.  Genitourinary: Positive for dysuria and frequency.  Musculoskeletal: Negative for falls.  Skin: Negative for rash.  Neurological: Negative for loss of consciousness and headaches.  Endo/Heme/Allergies: Negative for polydipsia.  Psychiatric/Behavioral: Negative for depression and suicidal ideas. The patient is not nervous/anxious and does not have insomnia.     Objective  There were no vitals taken for this visit.  Physical Exam  Physical Exam  Constitutional: She is oriented to person, place, and time and well-developed, well-nourished, and in no distress. No distress.  HENT:  Head: Normocephalic and atraumatic.  Eyes: Conjunctivae are normal.  Neck: Neck supple. No thyromegaly present.  Cardiovascular: Normal rate, regular rhythm and normal heart sounds.   No murmur heard. Pulmonary/Chest: Effort normal and breath sounds normal. She has no wheezes.  Abdominal: She exhibits no distension and no mass.  Musculoskeletal: She exhibits no edema.  Lymphadenopathy:    She has no cervical adenopathy.  Neurological: She is alert and oriented to person, place, and time.  Skin: Skin is warm and dry. No rash noted. She is not diaphoretic.  Psychiatric: Memory, affect and judgment normal.    Lab Results  Component Value Date   TSH  0.749 08/17/2012   Lab Results  Component Value Date   WBC 7.3 08/17/2012   HGB 12.7 08/17/2012   HCT 38.4 08/17/2012   MCV 87.9 08/17/2012   PLT 354 08/17/2012   Lab Results  Component Value Date   CREATININE 0.69 08/17/2012   BUN 11 08/17/2012   NA 135 08/17/2012   K 4.4 08/17/2012   CL 102 08/17/2012   CO2 29 08/17/2012   Lab Results  Component Value Date   ALT 9 08/17/2012   AST 10 08/17/2012   ALKPHOS 78 08/17/2012   BILITOT 0.4 08/17/2012   Lab Results  Component Value Date   CHOL 166 08/17/2012   Lab Results  Component Value Date   HDL 67 08/17/2012   Lab Results  Component Value Date   LDLCALC 88  08/17/2012   Lab Results  Component Value Date   TRIG 54 08/17/2012   Lab Results  Component Value Date   CHOLHDL 2.5 08/17/2012     Assessment & Plan  Patient did not show for visit, note was already formatted

## 2013-06-09 ENCOUNTER — Ambulatory Visit (INDEPENDENT_AMBULATORY_CARE_PROVIDER_SITE_OTHER): Payer: BC Managed Care – PPO | Admitting: Physician Assistant

## 2013-06-09 ENCOUNTER — Encounter: Payer: Self-pay | Admitting: Physician Assistant

## 2013-06-09 ENCOUNTER — Ambulatory Visit: Payer: BC Managed Care – PPO | Admitting: Physician Assistant

## 2013-06-09 VITALS — BP 100/72 | HR 86 | Temp 99.0°F | Resp 16 | Ht 63.0 in | Wt 155.0 lb

## 2013-06-09 DIAGNOSIS — R10816 Epigastric abdominal tenderness: Secondary | ICD-10-CM

## 2013-06-09 DIAGNOSIS — A048 Other specified bacterial intestinal infections: Secondary | ICD-10-CM | POA: Insufficient documentation

## 2013-06-09 DIAGNOSIS — K219 Gastro-esophageal reflux disease without esophagitis: Secondary | ICD-10-CM

## 2013-06-09 HISTORY — DX: Other specified bacterial intestinal infections: A04.8

## 2013-06-09 LAB — CBC WITH DIFFERENTIAL/PLATELET
Basophils Absolute: 0 10*3/uL (ref 0.0–0.1)
Basophils Relative: 0 % (ref 0–1)
Eosinophils Absolute: 0.1 10*3/uL (ref 0.0–0.7)
Eosinophils Relative: 2 % (ref 0–5)
HCT: 38.6 % (ref 36.0–46.0)
Hemoglobin: 13.2 g/dL (ref 12.0–15.0)
Lymphocytes Relative: 34 % (ref 12–46)
Lymphs Abs: 1.6 10*3/uL (ref 0.7–4.0)
MCH: 28.9 pg (ref 26.0–34.0)
MCHC: 34.2 g/dL (ref 30.0–36.0)
MCV: 84.6 fL (ref 78.0–100.0)
Monocytes Absolute: 0.3 10*3/uL (ref 0.1–1.0)
Monocytes Relative: 6 % (ref 3–12)
Neutro Abs: 2.8 10*3/uL (ref 1.7–7.7)
Neutrophils Relative %: 58 % (ref 43–77)
Platelets: 359 10*3/uL (ref 150–400)
RBC: 4.56 MIL/uL (ref 3.87–5.11)
RDW: 14 % (ref 11.5–15.5)
WBC: 4.8 10*3/uL (ref 4.0–10.5)

## 2013-06-09 LAB — LIPASE: Lipase: 14 U/L (ref 0–75)

## 2013-06-09 LAB — COMPREHENSIVE METABOLIC PANEL
ALT: 8 U/L (ref 0–35)
AST: 9 U/L (ref 0–37)
Albumin: 4.1 g/dL (ref 3.5–5.2)
Alkaline Phosphatase: 67 U/L (ref 39–117)
BUN: 10 mg/dL (ref 6–23)
CO2: 27 mEq/L (ref 19–32)
Calcium: 9.7 mg/dL (ref 8.4–10.5)
Chloride: 105 mEq/L (ref 96–112)
Creat: 0.71 mg/dL (ref 0.50–1.10)
Glucose, Bld: 123 mg/dL — ABNORMAL HIGH (ref 70–99)
Potassium: 4.2 mEq/L (ref 3.5–5.3)
Sodium: 140 mEq/L (ref 135–145)
Total Bilirubin: 0.6 mg/dL (ref 0.2–1.2)
Total Protein: 7.1 g/dL (ref 6.0–8.3)

## 2013-06-09 MED ORDER — OMEPRAZOLE 20 MG PO CPDR: 20.0000 mg | DELAYED_RELEASE_CAPSULE | Freq: Every day | ORAL | Status: DC

## 2013-06-09 NOTE — Progress Notes (Signed)
Pre visit review using our clinic review tool, if applicable. No additional management support is needed unless otherwise documented below in the visit note/SLS  

## 2013-06-09 NOTE — Assessment & Plan Note (Signed)
Rx Prilosec.  Daily probiotic.  Elevate HOB.  Avoid late-night eating.  See assessment/plan for Epigastric Tenderness.

## 2013-06-09 NOTE — Assessment & Plan Note (Signed)
Rx Prilosec.  Probiotic.  Increase fluid intake.  Will obtain CBC, CMP, lipase and H. Pylori.  TUMS at bedtime.  Elevate HOB.  Avoid alcohol or heavy foods. Avoid NSAIDs.  If labs unremarkable and symptoms persist despite PPI therapy, will refer to GI and obtain imaging.

## 2013-06-09 NOTE — Patient Instructions (Signed)
Please obtain labs.  I will call you with your results.  Increase fluids.  Take Prilosec as directed. Take two TUMS at bedtime. Take a daily probiotic (Culturelle, Digestive Advantage, One A Day, Align).  You can find these at the pharmacy next to the fiber supplements. Avoid heavy foods and spicy foods.  Avoid alcohol.  If your lab work is unremarkable and the Prilosec is not helping with symptoms, we may need imaging and to set you up with a Copywriter, advertising.

## 2013-06-09 NOTE — Progress Notes (Signed)
Patient presents to clinic today c/o acid reflux, belching and epigastric tenderness x 5 days. Denies history of acid reflux.  Denies recent alcohol or NSAID use.  Pain is constant and is a 3/10 on pain scale.  Pain is not worsened with eating.  Patient does endorses recently switching her diet to include more raw vegetables.  Patient endorses some mild constipation.  Denies tenesmus, melena or hematochezia.  Denies recent travel.  Past Medical History  Diagnosis Date  . Asthma     since childhood  . History of chicken pox     childhood  . Depression     no treatment  . Ulcer     stomach  . Seasonal allergies   . Fatigue 08/20/2012  . Mild intermittent asthma 03/11/2011  . Preventative health care 09/21/2012    No current outpatient prescriptions on file prior to visit.   No current facility-administered medications on file prior to visit.    No Known Allergies  Family History  Problem Relation Age of Onset  . Breast cancer Maternal Grandmother   . Hypertension Mother     maternal aunt  . Prostate cancer Neg Hx   . Colon cancer Neg Hx   . Heart disease Neg Hx   . Diabetes Neg Hx     History   Social History  . Marital Status: Single    Spouse Name: N/A    Number of Children: N/A  . Years of Education: N/A   Social History Main Topics  . Smoking status: Never Smoker   . Smokeless tobacco: Never Used  . Alcohol Use: No  . Drug Use: No  . Sexual Activity: None   Other Topics Concern  . None   Social History Narrative  . None   Review of Systems - See HPI.  All other ROS are negative.  BP 100/72  Pulse 86  Temp(Src) 99 F (37.2 C) (Oral)  Resp 16  Ht 5\' 3"  (1.6 m)  Wt 155 lb (70.308 kg)  BMI 27.46 kg/m2  SpO2 97%  LMP 05/01/2013  Physical Exam  Vitals reviewed. Constitutional: She is well-developed, well-nourished, and in no distress.  HENT:  Head: Normocephalic and atraumatic.  Eyes: Conjunctivae are normal.  Cardiovascular: Normal rate, regular  rhythm, normal heart sounds and intact distal pulses.   Pulmonary/Chest: Effort normal and breath sounds normal. No respiratory distress. She has no wheezes. She has no rales. She exhibits no tenderness.  Abdominal: Soft. Bowel sounds are normal. She exhibits no distension and no mass. There is no rebound and no guarding.  Mild epigastric tenderness with light and deep palpation.  No RLQ/LLQ tenderness.  Negative Murphy sign.  Skin: Skin is warm and dry. No rash noted.  Psychiatric: Affect normal.   Assessment/Plan: GERD (gastroesophageal reflux disease) Rx Prilosec.  Daily probiotic.  Elevate HOB.  Avoid late-night eating.  See assessment/plan for Epigastric Tenderness.  Epigastric abdominal tenderness Rx Prilosec.  Probiotic.  Increase fluid intake.  Will obtain CBC, CMP, lipase and H. Pylori.  TUMS at bedtime.  Elevate HOB.  Avoid alcohol or heavy foods. Avoid NSAIDs.  If labs unremarkable and symptoms persist despite PPI therapy, will refer to GI and obtain imaging.

## 2013-06-10 ENCOUNTER — Telehealth: Payer: Self-pay | Admitting: Physician Assistant

## 2013-06-10 DIAGNOSIS — A048 Other specified bacterial intestinal infections: Secondary | ICD-10-CM

## 2013-06-10 LAB — H. PYLORI ANTIBODY, IGG: H Pylori IgG: >8 {ISR} — ABNORMAL HIGH

## 2013-06-10 MED ORDER — AMOXICILLIN 500 MG PO CAPS: 1000.0000 mg | ORAL_CAPSULE | Freq: Two times a day (BID) | ORAL | Status: DC

## 2013-06-10 MED ORDER — CLARITHROMYCIN 500 MG PO TABS: 500.0000 mg | ORAL_TABLET | Freq: Two times a day (BID) | ORAL | Status: DC

## 2013-06-10 NOTE — Telephone Encounter (Signed)
See previous telephone encounter concerning treatment for H. Pylori.  Accidentally closed encounter instead of routing it.

## 2013-06-10 NOTE — Telephone Encounter (Signed)
Patient informed, understood & agreed; f/u visit scheduled w/PCP Mon, 06.08.15 @ 9:00a/SLS

## 2013-06-10 NOTE — Telephone Encounter (Signed)
Labs look good so far.  Still waiting on the results of her H. Pylori testing.   She should continue Nexium and care as discussed at yesterday's visit.  I will call her once the last lab has resulted.

## 2013-06-10 NOTE — Telephone Encounter (Signed)
H. Pylori shows evidence of infection.  This test does not distinguish active from previous infection.  Patient stated at visit that she was not familiar with ever being told she had an H. Pylori infection.  Will treat with Triple Therapy -- Amoxicillin, Clarithromycin (Biaxin) and Omeprazole (Prilosec). Antibiotics have been sent to her Pharmacy -- [facility] on [address].  She is to take the two antibiotic as directed.  Increase her Prilosec to 1 tablet twice daily over the next 7 days.  It is very important that she complete this therapy.  Follow-up in 2 weeks with myself or her PCP Dr. Charlett Blake.

## 2013-06-28 ENCOUNTER — Encounter: Payer: Self-pay | Admitting: Family Medicine

## 2013-06-28 ENCOUNTER — Ambulatory Visit (INDEPENDENT_AMBULATORY_CARE_PROVIDER_SITE_OTHER): Payer: BC Managed Care – PPO | Admitting: Family Medicine

## 2013-06-28 ENCOUNTER — Telehealth: Payer: Self-pay | Admitting: Family Medicine

## 2013-06-28 VITALS — BP 108/72 | HR 68 | Temp 99.2°F | Ht 63.0 in | Wt 156.0 lb

## 2013-06-28 DIAGNOSIS — K219 Gastro-esophageal reflux disease without esophagitis: Secondary | ICD-10-CM

## 2013-06-28 DIAGNOSIS — Z23 Encounter for immunization: Secondary | ICD-10-CM

## 2013-06-28 DIAGNOSIS — D649 Anemia, unspecified: Secondary | ICD-10-CM

## 2013-06-28 DIAGNOSIS — M542 Cervicalgia: Secondary | ICD-10-CM

## 2013-06-28 DIAGNOSIS — Z0000 Encounter for general adult medical examination without abnormal findings: Secondary | ICD-10-CM

## 2013-06-28 DIAGNOSIS — A048 Other specified bacterial intestinal infections: Secondary | ICD-10-CM

## 2013-06-28 MED ORDER — CALCIUM CITRATE-VITAMIN D 250-200 MG-UNIT PO TABS: 1.0000 | ORAL_TABLET | Freq: Two times a day (BID) | ORAL | Status: DC

## 2013-06-28 MED ORDER — RANITIDINE HCL 300 MG PO TABS: 300.0000 mg | ORAL_TABLET | Freq: Every evening | ORAL | Status: DC | PRN

## 2013-06-28 NOTE — Telephone Encounter (Signed)
LAB ORDER WEEK OF 10-12-20Return in about 4 months (around 10/28/2013) for lipid, renal, cbc, tsh, hepatic, hgba1c prior, annual exam. 15

## 2013-06-28 NOTE — Assessment & Plan Note (Signed)
Requests and is given rx for Citracal bid for her Flex account. Given Tdap today

## 2013-06-28 NOTE — Assessment & Plan Note (Signed)
Has responded well to treatments continue probiotics

## 2013-06-28 NOTE — Assessment & Plan Note (Signed)
Repeat labs with annual lab in fall or as needed

## 2013-06-28 NOTE — Assessment & Plan Note (Addendum)
Doing better, does still have occasional symptoms. Avoid offending foods, start probiotics. Do not eat large meals in late evening and consider raising head of bed. Given rx for Ranitidine which she uses intermittently

## 2013-06-28 NOTE — Patient Instructions (Addendum)
Probiotic daily such as Hardin Negus colon health, digestive advantage or a generic  DASH Diet The DASH diet stands for "Dietary Approaches to Stop Hypertension." It is a healthy eating plan that has been shown to reduce high blood pressure (hypertension) in as little as 14 days, while also possibly providing other significant health benefits. These other health benefits include reducing the risk of breast cancer after menopause and reducing the risk of type 2 diabetes, heart disease, colon cancer, and stroke. Health benefits also include weight loss and slowing kidney failure in patients with chronic kidney disease.  DIET GUIDELINES  Limit salt (sodium). Your diet should contain less than 1500 mg of sodium daily.  Limit refined or processed carbohydrates. Your diet should include mostly whole grains. Desserts and added sugars should be used sparingly.  Include small amounts of heart-healthy fats. These types of fats include nuts, oils, and tub margarine. Limit saturated and trans fats. These fats have been shown to be harmful in the body. CHOOSING FOODS  The following food groups are based on a 2000 calorie diet. See your Registered Dietitian for individual calorie needs. Grains and Grain Products (6 to 8 servings daily)  Eat More Often: Whole-wheat bread, brown rice, whole-grain or wheat pasta, quinoa, popcorn without added fat or salt (air popped).  Eat Less Often: White bread, white pasta, white rice, cornbread. Vegetables (4 to 5 servings daily)  Eat More Often: Fresh, frozen, and canned vegetables. Vegetables may be raw, steamed, roasted, or grilled with a minimal amount of fat.  Eat Less Often/Avoid: Creamed or fried vegetables. Vegetables in a cheese sauce. Fruit (4 to 5 servings daily)  Eat More Often: All fresh, canned (in natural juice), or frozen fruits. Dried fruits without added sugar. One hundred percent fruit juice ( cup [237 mL] daily).  Eat Less Often: Dried fruits with  added sugar. Canned fruit in light or heavy syrup. [employer], Fish, and Poultry (2 servings or less daily. One serving is 3 to 4 oz [85-114 g]).  Eat More Often: Ninety percent or leaner ground beef, tenderloin, sirloin. Round cuts of beef, chicken breast, [place] breast. All fish. Grill, bake, or broil your meat. Nothing should be fried.  Eat Less Often/Avoid: Fatty cuts of meat, [place], or chicken leg, thigh, or wing. Fried cuts of meat or fish. Dairy (2 to 3 servings)  Eat More Often: Low-fat or fat-free milk, low-fat plain or light yogurt, reduced-fat or part-skim cheese.  Eat Less Often/Avoid: Milk (whole, 2%).Whole milk yogurt. Full-fat cheeses. Nuts, Seeds, and Legumes (4 to 5 servings per week)  Eat More Often: All without added salt.  Eat Less Often/Avoid: Salted nuts and seeds, canned beans with added salt. Fats and Sweets (limited)  Eat More Often: Vegetable oils, tub margarines without trans fats, sugar-free gelatin. Mayonnaise and salad dressings.  Eat Less Often/Avoid: Coconut oils, palm oils, butter, stick margarine, cream, half and half, cookies, candy, pie. FOR MORE INFORMATION The Dash Diet Eating Plan: www.dashdiet.org Document Released: 12/27/2010 Document Revised: 04/01/2011 Document Reviewed: 12/27/2010 [facility] Patient Information 2014 [place], [place]. Helicobacter Pylori Antibodies Test This is a blood test which looks for a germ called Helicobacter pylori. This can also be diagnosed by a breath test or a microscopic examination of a portion (biopsy) of the small bowel. H. pylori is a germ that is found in the cells that line the stomach. It is a risk factor for stomach and small bowel ulcers, long standing inflammation of the lining of the stomach, or even  ulcers that may occur in the esophagus (the canal that runs from the mouth to the stomach). This bacterium is also a factor in stomach cancer. The amount of the bacteria is found in about 10% of healthy persons  younger than 47 years of age and the amount of the bacteria increases with age. Most persons with these bacteria have no symptoms; however, it is thought that when these bacteria cause ulcers, antibiotic medications can be used to help eliminate or reduce the problem.  PREPARATION FOR TEST No preparation or fasting is necessary. NORMAL FINDINGS Negative (H-pylori bacteria not present) Ranges for normal findingsmay vary among different laboratories and hospitals. You should always check with your doctor after having lab work or other tests done to discuss the meaning of your test results and whether or not your values are considered within normal limits. MEANING OF TEST  Your caregiver will go over the test results with you and discuss the importance and meaning of your results, as well as treatment options and the need for additional tests if necessary. It is your responsibility to obtain your test results. Ask the lab or department performing the test when and how you will get your results. OBTAINING THE TEST RESULTS It is your responsibility to obtain your test results. Ask the lab or department performing the test when and how you will get your results. Document Released: 02/01/2004 Document Revised: 04/01/2011 Document Reviewed: 12/19/2007 [facility] Patient Information 2014 [place], [place].

## 2013-06-28 NOTE — Progress Notes (Signed)
Patient ID: Anne Huffman, female   DOB: 26-Dec-1965, 48 y.o.   MRN: [id number] Anne Huffman [id number] 04-06-1965 06/28/2013      Progress Note-Follow Up  Subjective  Chief Complaint  Chief Complaint  Patient presents with  . Follow-up    2 week  . Injections    tdap    HPI  Patient is a 48 year old female in today for routine medical care. She is in today for followup. At her last visit she was having epigastric pain and testing revealed H. Pylori. She has been able to tolerate and take the entire course of treatment. She is feeling better. She continues to have reflux symptoms but much less frequently. No more epigastric discomfort. Her bowels are moving comfortably. No fevers or chills. She is complaining of some neck pain. She notes abdomen discomfort in her right neck intermittently and it is present at this time. No paresthesias or radiculopathy. No recent trauma or falls. Denies CP/palp/SOB/HA/congestion/fevers/GU c/o. Taking meds as prescribed  Past Medical History  Diagnosis Date  . Asthma     since childhood  . History of chicken pox     childhood  . Depression     no treatment  . Ulcer     stomach  . Seasonal allergies   . Fatigue 08/20/2012  . Mild intermittent asthma 03/11/2011  . Preventative health care 09/21/2012  . H. pylori infection 06/09/2013    Past Surgical History  Procedure Laterality Date  . No past surgeries  03/11/2011    Family History  Problem Relation Age of Onset  . Breast cancer Maternal Grandmother   . Hypertension Mother     maternal aunt  . Prostate cancer Neg Hx   . Colon cancer Neg Hx   . Heart disease Neg Hx   . Diabetes Neg Hx     History   Social History  . Marital Status: Single    Spouse Name: N/A    Number of Children: N/A  . Years of Education: N/A   Occupational History  . Not on file.   Social History Main Topics  . Smoking status: Never Smoker   . Smokeless tobacco: Never Used  . Alcohol Use: No  . Drug Use:  No  . Sexual Activity: Not on file   Other Topics Concern  . Not on file   Social History Narrative  . No narrative on file    No current outpatient prescriptions on file prior to visit.   No current facility-administered medications on file prior to visit.    No Known Allergies  Review of Systems  Review of Systems  Constitutional: Negative for fever and malaise/fatigue.  HENT: Negative for congestion.   Eyes: Negative for discharge.  Respiratory: Negative for shortness of breath.   Cardiovascular: Negative for chest pain, palpitations and leg swelling.  Gastrointestinal: Positive for heartburn. Negative for nausea, abdominal pain and diarrhea.  Genitourinary: Negative for dysuria.  Musculoskeletal: Positive for neck pain. Negative for falls.  Skin: Negative for rash.  Neurological: Negative for loss of consciousness and headaches.  Endo/Heme/Allergies: Negative for polydipsia.  Psychiatric/Behavioral: Negative for depression and suicidal ideas. The patient is not nervous/anxious and does not have insomnia.     Objective  BP 108/72  Pulse 68  Temp(Src) 99.2 F (37.3 C)  Ht 5\' 3"  (1.6 m)  Wt 156 lb 0.6 oz (70.779 kg)  BMI 27.65 kg/m2  SpO2 98%  LMP 05/01/2013  Physical Exam  Physical Exam  Constitutional: She is oriented to person, place, and time and well-developed, well-nourished, and in no distress. No distress.  HENT:  Head: Normocephalic and atraumatic.  Eyes: Conjunctivae are normal.  Neck: Neck supple. No thyromegaly present.  Cardiovascular: Normal rate, regular rhythm and normal heart sounds.   No murmur heard. Pulmonary/Chest: Effort normal and breath sounds normal. She has no wheezes.  Abdominal: She exhibits no distension and no mass.  Musculoskeletal: Normal range of motion. She exhibits tenderness. She exhibits no edema.  Spasm noted in right SCM muscle  Lymphadenopathy:    She has no cervical adenopathy.  Neurological: She is alert and  oriented to person, place, and time.  Skin: Skin is warm and dry. No rash noted. She is not diaphoretic.  Psychiatric: Memory, affect and judgment normal.    Lab Results  Component Value Date   TSH 0.749 08/17/2012   Lab Results  Component Value Date   WBC 4.8 06/09/2013   HGB 13.2 06/09/2013   HCT 38.6 06/09/2013   MCV 84.6 06/09/2013   PLT 359 06/09/2013   Lab Results  Component Value Date   CREATININE 0.71 06/09/2013   BUN 10 06/09/2013   NA 140 06/09/2013   K 4.2 06/09/2013   CL 105 06/09/2013   CO2 27 06/09/2013   Lab Results  Component Value Date   ALT 8 06/09/2013   AST 9 06/09/2013   ALKPHOS 67 06/09/2013   BILITOT 0.6 06/09/2013   Lab Results  Component Value Date   CHOL 166 08/17/2012   Lab Results  Component Value Date   HDL 67 08/17/2012   Lab Results  Component Value Date   LDLCALC 88 08/17/2012   Lab Results  Component Value Date   TRIG 54 08/17/2012   Lab Results  Component Value Date   CHOLHDL 2.5 08/17/2012     Assessment & Plan  H. pylori infection Has responded well to treatments continue probiotics  GERD (gastroesophageal reflux disease) Doing better, does still have occasional symptoms. Avoid offending foods, start probiotics. Do not eat large meals in late evening and consider raising head of bed. Given rx for Ranitidine which she uses intermittently  Preventative health care Requests and is given rx for Citracal bid for her Flex account. Given Tdap today  Neck pain on right side Encouraged moist heat and gentle stretching as tolerated. May try NSAIDs and prescription meds as directed and report if symptoms worsen or seek immediate care. Call if symptoms worsen for further treatment  Anemia Repeat labs with annual lab in fall or as needed

## 2013-06-28 NOTE — Assessment & Plan Note (Signed)
Encouraged moist heat and gentle stretching as tolerated. May try NSAIDs and prescription meds as directed and report if symptoms worsen or seek immediate care. Call if symptoms worsen for further treatment

## 2013-06-28 NOTE — Progress Notes (Signed)
Pre visit review using our clinic review tool, if applicable. No additional management support is needed unless otherwise documented below in the visit note. 

## 2013-11-12 ENCOUNTER — Telehealth: Payer: Self-pay | Admitting: *Deleted

## 2013-11-12 ENCOUNTER — Encounter: Payer: BC Managed Care – PPO | Admitting: Family Medicine

## 2013-11-12 DIAGNOSIS — Z0289 Encounter for other administrative examinations: Secondary | ICD-10-CM

## 2013-11-12 NOTE — Telephone Encounter (Signed)
Pt did not show for appointment 11/12/2013 at 1:30pm for CPE

## 2013-11-14 NOTE — Telephone Encounter (Signed)
Will need follow up when available

## 2014-04-07 ENCOUNTER — Telehealth: Payer: Self-pay | Admitting: *Deleted

## 2014-04-07 NOTE — Telephone Encounter (Signed)
Unable to reach patient at time of call.  Left message on voicemail for patient to return call when available.

## 2014-04-08 ENCOUNTER — Ambulatory Visit (INDEPENDENT_AMBULATORY_CARE_PROVIDER_SITE_OTHER): Payer: BLUE CROSS/BLUE SHIELD | Admitting: Family Medicine

## 2014-04-08 ENCOUNTER — Encounter: Payer: Self-pay | Admitting: Family Medicine

## 2014-04-08 VITALS — BP 130/86 | HR 70 | Temp 97.7°F | Ht 63.0 in | Wt 154.2 lb

## 2014-04-08 DIAGNOSIS — R739 Hyperglycemia, unspecified: Secondary | ICD-10-CM

## 2014-04-08 DIAGNOSIS — Z0000 Encounter for general adult medical examination without abnormal findings: Secondary | ICD-10-CM | POA: Diagnosis not present

## 2014-04-08 DIAGNOSIS — K219 Gastro-esophageal reflux disease without esophagitis: Secondary | ICD-10-CM | POA: Diagnosis not present

## 2014-04-08 DIAGNOSIS — R109 Unspecified abdominal pain: Secondary | ICD-10-CM

## 2014-04-08 DIAGNOSIS — H547 Unspecified visual loss: Secondary | ICD-10-CM

## 2014-04-08 DIAGNOSIS — D649 Anemia, unspecified: Secondary | ICD-10-CM | POA: Diagnosis not present

## 2014-04-08 LAB — COMPREHENSIVE METABOLIC PANEL
ALT: 10 U/L (ref 0–35)
AST: 12 U/L (ref 0–37)
Albumin: 4.1 g/dL (ref 3.5–5.2)
Alkaline Phosphatase: 88 U/L (ref 39–117)
BUN: 15 mg/dL (ref 6–23)
CO2: 30 mEq/L (ref 19–32)
Calcium: 9.6 mg/dL (ref 8.4–10.5)
Chloride: 105 mEq/L (ref 96–112)
Creatinine, Ser: 0.7 mg/dL (ref 0.40–1.20)
GFR: 115.06 mL/min (ref >60.00)
Glucose, Bld: 97 mg/dL (ref 70–99)
Potassium: 4 mEq/L (ref 3.5–5.1)
Sodium: 139 mEq/L (ref 135–145)
Total Bilirubin: 0.4 mg/dL (ref 0.2–1.2)
Total Protein: 7.6 g/dL (ref 6.0–8.3)

## 2014-04-08 LAB — CBC
HCT: 37.5 % (ref 36.0–46.0)
Hemoglobin: 12.6 g/dL (ref 12.0–15.0)
MCHC: 33.6 g/dL (ref 30.0–36.0)
MCV: 86.5 fl (ref 78.0–100.0)
Platelets: 310 10*3/uL (ref 150.0–400.0)
RBC: 4.33 Mil/uL (ref 3.87–5.11)
RDW: 13.6 % (ref 11.5–15.5)
WBC: 5.5 10*3/uL (ref 4.0–10.5)

## 2014-04-08 LAB — LIPID PANEL
Cholesterol: 187 mg/dL (ref 0–200)
HDL: 82.8 mg/dL (ref >39.00)
LDL Cholesterol: 98 mg/dL (ref 0–99)
NonHDL: 104.2
Total CHOL/HDL Ratio: 2
Triglycerides: 31 mg/dL (ref 0.0–149.0)
VLDL: 6.2 mg/dL (ref 0.0–40.0)

## 2014-04-08 LAB — TSH: TSH: 1.11 u[IU]/mL (ref 0.35–4.50)

## 2014-04-08 LAB — HEMOGLOBIN A1C: Hgb A1c MFr Bld: 5.7 % (ref 4.6–6.5)

## 2014-04-08 NOTE — Patient Instructions (Addendum)
Krill oil caps such as MegaRed caps daily, probiotics such as Digestive Advantage or [employer], Vitamin D 2000 IU daily  Or order online [email or web address], [place], they make all of the above   For a virus illness, start Mucinex 600 mg twice daily x 10 days, Zinc daily such as Coldeeze or Xicam, probiotic  Preventive Care for Adults A healthy lifestyle and preventive care can promote health and wellness. Preventive health guidelines for women include the following key practices.  A routine yearly physical is a good way to check with your health care provider about your health and preventive screening. It is a chance to share any concerns and updates on your health and to receive a thorough exam.  Visit your dentist for a routine exam and preventive care every 6 months. Brush your teeth twice a day and floss once a day. Good oral hygiene prevents tooth decay and gum disease.  The frequency of eye exams is based on your age, health, family medical history, use of contact lenses, and other factors. Follow your health care provider's recommendations for frequency of eye exams.  Eat a healthy diet. Foods like vegetables, fruits, whole grains, low-fat dairy products, and lean protein foods contain the nutrients you need without too many calories. Decrease your intake of foods high in solid fats, added sugars, and salt. Eat the right amount of calories for you.Get information about a proper diet from your health care provider, if necessary.  Regular physical exercise is one of the most important things you can do for your health. Most adults should get at least 150 minutes of moderate-intensity exercise (any activity that increases your heart rate and causes you to sweat) each week. In addition, most adults need muscle-strengthening exercises on 2 or more days a week.  Maintain a healthy weight. The body mass index (BMI) is a screening tool to identify possible weight problems. It  provides an estimate of body fat based on height and weight. Your health care provider can find your BMI and can help you achieve or maintain a healthy weight.For adults 20 years and older:  A BMI below 18.5 is considered underweight.  A BMI of 18.5 to 24.9 is normal.  A BMI of 25 to 29.9 is considered overweight.  A BMI of 30 and above is considered obese.  Maintain normal blood lipids and cholesterol levels by exercising and minimizing your intake of saturated fat. Eat a balanced diet with plenty of fruit and vegetables. Blood tests for lipids and cholesterol should begin at age 91 and be repeated every 5 years. If your lipid or cholesterol levels are high, you are over 50, or you are at high risk for heart disease, you may need your cholesterol levels checked more frequently.Ongoing high lipid and cholesterol levels should be treated with medicines if diet and exercise are not working.  If you smoke, find out from your health care provider how to quit. If you do not use tobacco, do not start.  Lung cancer screening is recommended for adults aged 24-80 years who are at high risk for developing lung cancer because of a history of smoking. A yearly low-dose CT scan of the lungs is recommended for people who have at least a 30-pack-year history of smoking and are a current smoker or have quit within the past 15 years. A pack year of smoking is smoking an average of 1 pack of cigarettes a day for 1 year (for example: 1 pack a day for  30 years or 2 packs a day for 15 years). Yearly screening should continue until the smoker has stopped smoking for at least 15 years. Yearly screening should be stopped for people who develop a health problem that would prevent them from having lung cancer treatment.  If you are pregnant, do not drink alcohol. If you are breastfeeding, be very cautious about drinking alcohol. If you are not pregnant and choose to drink alcohol, do not have more than 1 drink per day. One  drink is considered to be 12 ounces (355 mL) of beer, 5 ounces (148 mL) of wine, or 1.5 ounces (44 mL) of liquor.  Avoid use of street drugs. Do not share needles with anyone. Ask for help if you need support or instructions about stopping the use of drugs.  High blood pressure causes heart disease and increases the risk of stroke. Your blood pressure should be checked at least every 1 to 2 years. Ongoing high blood pressure should be treated with medicines if weight loss and exercise do not work.  If you are 57-51 years old, ask your health care provider if you should take aspirin to prevent strokes.  Diabetes screening involves taking a blood sample to check your fasting blood sugar level. This should be done once every 3 years, after age 33, if you are within normal weight and without risk factors for diabetes. Testing should be considered at a younger age or be carried out more frequently if you are overweight and have at least 1 risk factor for diabetes.  Breast cancer screening is essential preventive care for women. You should practice "breast self-awareness." This means understanding the normal appearance and feel of your breasts and may include breast self-examination. Any changes detected, no matter how small, should be reported to a health care provider. Women in their 51s and 30s should have a clinical breast exam (CBE) by a health care provider as part of a regular health exam every 1 to 3 years. After age 80, women should have a CBE every year. Starting at age 35, women should consider having a mammogram (breast X-ray test) every year. Women who have a family history of breast cancer should talk to their health care provider about genetic screening. Women at a high risk of breast cancer should talk to their health care providers about having an MRI and a mammogram every year.  Breast cancer gene (BRCA)-related cancer risk assessment is recommended for women who have family members with  BRCA-related cancers. BRCA-related cancers include breast, ovarian, tubal, and peritoneal cancers. Having family members with these cancers may be associated with an increased risk for harmful changes (mutations) in the breast cancer genes BRCA1 and BRCA2. Results of the assessment will determine the need for genetic counseling and BRCA1 and BRCA2 testing.  Routine pelvic exams to screen for cancer are no longer recommended for nonpregnant women who are considered low risk for cancer of the pelvic organs (ovaries, uterus, and vagina) and who do not have symptoms. Ask your health care provider if a screening pelvic exam is right for you.  If you have had past treatment for cervical cancer or a condition that could lead to cancer, you need Pap tests and screening for cancer for at least 20 years after your treatment. If Pap tests have been discontinued, your risk factors (such as having a new sexual partner) need to be reassessed to determine if screening should be resumed. Some women have medical problems that increase the chance of  getting cervical cancer. In these cases, your health care provider may recommend more frequent screening and Pap tests.  The HPV test is an additional test that may be used for cervical cancer screening. The HPV test looks for the virus that can cause the cell changes on the cervix. The cells collected during the Pap test can be tested for HPV. The HPV test could be used to screen women aged 30 years and older, and should be used in women of any age who have unclear Pap test results. After the age of 6, women should have HPV testing at the same frequency as a Pap test.  Colorectal cancer can be detected and often prevented. Most routine colorectal cancer screening begins at the age of 46 years and continues through age 66 years. However, your health care provider may recommend screening at an earlier age if you have risk factors for colon cancer. On a yearly basis, your health  care provider may provide home test kits to check for hidden blood in the stool. Use of a small camera at the end of a tube, to directly examine the colon (sigmoidoscopy or colonoscopy), can detect the earliest forms of colorectal cancer. Talk to your health care provider about this at age 92, when routine screening begins. Direct exam of the colon should be repeated every 5-10 years through age 10 years, unless early forms of pre-cancerous polyps or small growths are found.  People who are at an increased risk for hepatitis B should be screened for this virus. You are considered at high risk for hepatitis B if:  You were born in a country where hepatitis B occurs often. Talk with your health care provider about which countries are considered high risk.  Your parents were born in a high-risk country and you have not received a shot to protect against hepatitis B (hepatitis B vaccine).  You have HIV or AIDS.  You use needles to inject street drugs.  You live with, or have sex with, someone who has hepatitis B.  You get hemodialysis treatment.  You take certain medicines for conditions like cancer, organ transplantation, and autoimmune conditions.  Hepatitis C blood testing is recommended for all people born from [birth year] through 1965 and any individual with known risks for hepatitis C.  Practice safe sex. Use condoms and avoid high-risk sexual practices to reduce the spread of sexually transmitted infections (STIs). STIs include gonorrhea, chlamydia, syphilis, trichomonas, herpes, HPV, and human immunodeficiency virus (HIV). Herpes, HIV, and HPV are viral illnesses that have no cure. They can result in disability, cancer, and death.  You should be screened for sexually transmitted illnesses (STIs) including gonorrhea and chlamydia if:  You are sexually active and are younger than 24 years.  You are older than 24 years and your health care provider tells you that you are at risk for this type of  infection.  Your sexual activity has changed since you were last screened and you are at an increased risk for chlamydia or gonorrhea. Ask your health care provider if you are at risk.  If you are at risk of being infected with HIV, it is recommended that you take a prescription medicine daily to prevent HIV infection. This is called preexposure prophylaxis (PrEP). You are considered at risk if:  You are a heterosexual woman, are sexually active, and are at increased risk for HIV infection.  You take drugs by injection.  You are sexually active with a partner who has HIV.  Talk  with your health care provider about whether you are at high risk of being infected with HIV. If you choose to begin PrEP, you should first be tested for HIV. You should then be tested every 3 months for as long as you are taking PrEP.  Osteoporosis is a disease in which the bones lose minerals and strength with aging. This can result in serious bone fractures or breaks. The risk of osteoporosis can be identified using a bone density scan. Women ages 50 years and over and women at risk for fractures or osteoporosis should discuss screening with their health care providers. Ask your health care provider whether you should take a calcium supplement or vitamin D to reduce the rate of osteoporosis.  Menopause can be associated with physical symptoms and risks. Hormone replacement therapy is available to decrease symptoms and risks. You should talk to your health care provider about whether hormone replacement therapy is right for you.  Use sunscreen. Apply sunscreen liberally and repeatedly throughout the day. You should seek shade when your shadow is shorter than you. Protect yourself by wearing long sleeves, pants, a wide-brimmed hat, and sunglasses year round, whenever you are outdoors.  Once a month, do a whole body skin exam, using a mirror to look at the skin on your back. Tell your health care provider of new moles,  moles that have irregular borders, moles that are larger than a pencil eraser, or moles that have changed in shape or color.  Stay current with required vaccines (immunizations).  Influenza vaccine. All adults should be immunized every year.  Tetanus, diphtheria, and acellular pertussis (Td, Tdap) vaccine. Pregnant women should receive 1 dose of Tdap vaccine during each pregnancy. The dose should be obtained regardless of the length of time since the last dose. Immunization is preferred during the 27th-36th week of gestation. An adult who has not previously received Tdap or who does not know her vaccine status should receive 1 dose of Tdap. This initial dose should be followed by tetanus and diphtheria toxoids (Td) booster doses every 10 years. Adults with an unknown or incomplete history of completing a 3-dose immunization series with Td-containing vaccines should begin or complete a primary immunization series including a Tdap dose. Adults should receive a Td booster every 10 years.  Varicella vaccine. An adult without evidence of immunity to varicella should receive 2 doses or a second dose if she has previously received 1 dose. Pregnant females who do not have evidence of immunity should receive the first dose after pregnancy. This first dose should be obtained before leaving the health care facility. The second dose should be obtained 4-8 weeks after the first dose.  Human papillomavirus (HPV) vaccine. Females aged 13-26 years who have not received the vaccine previously should obtain the 3-dose series. The vaccine is not recommended for use in pregnant females. However, pregnancy testing is not needed before receiving a dose. If a female is found to be pregnant after receiving a dose, no treatment is needed. In that case, the remaining doses should be delayed until after the pregnancy. Immunization is recommended for any person with an immunocompromised condition through the age of 14 years if she  did not get any or all doses earlier. During the 3-dose series, the second dose should be obtained 4-8 weeks after the first dose. The third dose should be obtained 24 weeks after the first dose and 16 weeks after the second dose.  Zoster vaccine. One dose is recommended for adults aged  60 years or older unless certain conditions are present.  Measles, mumps, and rubella (MMR) vaccine. Adults born before 50 generally are considered immune to measles and mumps. Adults born in [birth year] or later should have 1 or more doses of MMR vaccine unless there is a contraindication to the vaccine or there is laboratory evidence of immunity to each of the three diseases. A routine second dose of MMR vaccine should be obtained at least 28 days after the first dose for students attending postsecondary schools, health care workers, or international travelers. People who received inactivated measles vaccine or an unknown type of measles vaccine during 1963-1967 should receive 2 doses of MMR vaccine. People who received inactivated mumps vaccine or an unknown type of mumps vaccine before 1979 and are at high risk for mumps infection should consider immunization with 2 doses of MMR vaccine. For females of childbearing age, rubella immunity should be determined. If there is no evidence of immunity, females who are not pregnant should be vaccinated. If there is no evidence of immunity, females who are pregnant should delay immunization until after pregnancy. Unvaccinated health care workers born before [birth year] who lack laboratory evidence of measles, mumps, or rubella immunity or laboratory confirmation of disease should consider measles and mumps immunization with 2 doses of MMR vaccine or rubella immunization with 1 dose of MMR vaccine.  Pneumococcal 13-valent conjugate (PCV13) vaccine. When indicated, a person who is uncertain of her immunization history and has no record of immunization should receive the PCV13 vaccine. An adult  aged 52 years or older who has certain medical conditions and has not been previously immunized should receive 1 dose of PCV13 vaccine. This PCV13 should be followed with a dose of pneumococcal polysaccharide (PPSV23) vaccine. The PPSV23 vaccine dose should be obtained at least 8 weeks after the dose of PCV13 vaccine. An adult aged 39 years or older who has certain medical conditions and previously received 1 or more doses of PPSV23 vaccine should receive 1 dose of PCV13. The PCV13 vaccine dose should be obtained 1 or more years after the last PPSV23 vaccine dose.  Pneumococcal polysaccharide (PPSV23) vaccine. When PCV13 is also indicated, PCV13 should be obtained first. All adults aged 36 years and older should be immunized. An adult younger than age 39 years who has certain medical conditions should be immunized. Any person who resides in a nursing home or long-term care facility should be immunized. An adult smoker should be immunized. People with an immunocompromised condition and certain other conditions should receive both PCV13 and PPSV23 vaccines. People with human immunodeficiency virus (HIV) infection should be immunized as soon as possible after diagnosis. Immunization during chemotherapy or radiation therapy should be avoided. Routine use of PPSV23 vaccine is not recommended for American Indians, [place] Natives, or people younger than 65 years unless there are medical conditions that require PPSV23 vaccine. When indicated, people who have unknown immunization and have no record of immunization should receive PPSV23 vaccine. One-time revaccination 5 years after the first dose of PPSV23 is recommended for people aged 19-64 years who have chronic kidney failure, nephrotic syndrome, asplenia, or immunocompromised conditions. People who received 1-2 doses of PPSV23 before age 109 years should receive another dose of PPSV23 vaccine at age 55 years or later if at least 5 years have passed since the previous  dose. Doses of PPSV23 are not needed for people immunized with PPSV23 at or after age 42 years.  Meningococcal vaccine. Adults with asplenia or persistent complement component deficiencies  should receive 2 doses of quadrivalent meningococcal conjugate (MenACWY-D) vaccine. The doses should be obtained at least 2 months apart. Microbiologists working with certain meningococcal bacteria, [employer] recruits, people at risk during an outbreak, and people who travel to or live in countries with a high rate of meningitis should be immunized. A first-year college student up through age 58 years who is living in a residence hall should receive a dose if she did not receive a dose on or after her 16th birthday. Adults who have certain high-risk conditions should receive one or more doses of vaccine.  Hepatitis A vaccine. Adults who wish to be protected from this disease, have certain high-risk conditions, work with hepatitis A-infected animals, work in hepatitis A research labs, or travel to or work in countries with a high rate of hepatitis A should be immunized. Adults who were previously unvaccinated and who anticipate close contact with an international adoptee during the first 60 days after arrival in the [place] from a country with a high rate of hepatitis A should be immunized.  Hepatitis B vaccine. Adults who wish to be protected from this disease, have certain high-risk conditions, may be exposed to blood or other infectious body fluids, are household contacts or sex partners of hepatitis B positive people, are clients or workers in certain care facilities, or travel to or work in countries with a high rate of hepatitis B should be immunized.  Haemophilus influenzae type b (Hib) vaccine. A previously unvaccinated person with asplenia or sickle cell disease or having a scheduled splenectomy should receive 1 dose of Hib vaccine. Regardless of previous immunization, a recipient of a hematopoietic stem cell  transplant should receive a 3-dose series 6-12 months after her successful transplant. Hib vaccine is not recommended for adults with HIV infection. Preventive Services / Frequency Ages 59 to 28 years  Blood pressure check.** / Every 1 to 2 years.  Lipid and cholesterol check.** / Every 5 years beginning at age 54.  Clinical breast exam.** / Every 3 years for women in their 74s and 64s.  BRCA-related cancer risk assessment.** / For women who have family members with a BRCA-related cancer (breast, ovarian, tubal, or peritoneal cancers).  Pap test.** / Every 2 years from ages 31 through 69. Every 3 years starting at age 3 through age 66 or 46 with a history of 3 consecutive normal Pap tests.  HPV screening.** / Every 3 years from ages 35 through ages 47 to 23 with a history of 3 consecutive normal Pap tests.  Hepatitis C blood test.** / For any individual with known risks for hepatitis C.  Skin self-exam. / Monthly.  Influenza vaccine. / Every year.  Tetanus, diphtheria, and acellular pertussis (Tdap, Td) vaccine.** / Consult your health care provider. Pregnant women should receive 1 dose of Tdap vaccine during each pregnancy. 1 dose of Td every 10 years.  Varicella vaccine.** / Consult your health care provider. Pregnant females who do not have evidence of immunity should receive the first dose after pregnancy.  HPV vaccine. / 3 doses over 6 months, if 25 and younger. The vaccine is not recommended for use in pregnant females. However, pregnancy testing is not needed before receiving a dose.  Measles, mumps, rubella (MMR) vaccine.** / You need at least 1 dose of MMR if you were born in 1957 or later. You may also need a 2nd dose. For females of childbearing age, rubella immunity should be determined. If there is no evidence of immunity, females  who are not pregnant should be vaccinated. If there is no evidence of immunity, females who are pregnant should delay immunization until after  pregnancy.  Pneumococcal 13-valent conjugate (PCV13) vaccine.** / Consult your health care provider.  Pneumococcal polysaccharide (PPSV23) vaccine.** / 1 to 2 doses if you smoke cigarettes or if you have certain conditions.  Meningococcal vaccine.** / 1 dose if you are age 59 to 59 years and a Market researcher living in a residence hall, or have one of several medical conditions, you need to get vaccinated against meningococcal disease. You may also need additional booster doses.  Hepatitis A vaccine.** / Consult your health care provider.  Hepatitis B vaccine.** / Consult your health care provider.  Haemophilus influenzae type b (Hib) vaccine.** / Consult your health care provider. Ages 27 to 17 years  Blood pressure check.** / Every 1 to 2 years.  Lipid and cholesterol check.** / Every 5 years beginning at age 6 years.  Lung cancer screening. / Every year if you are aged 54-80 years and have a 30-pack-year history of smoking and currently smoke or have quit within the past 15 years. Yearly screening is stopped once you have quit smoking for at least 15 years or develop a health problem that would prevent you from having lung cancer treatment.  Clinical breast exam.** / Every year after age 66 years.  BRCA-related cancer risk assessment.** / For women who have family members with a BRCA-related cancer (breast, ovarian, tubal, or peritoneal cancers).  Mammogram.** / Every year beginning at age 81 years and continuing for as long as you are in good health. Consult with your health care provider.  Pap test.** / Every 3 years starting at age 37 years through age 62 or 10 years with a history of 3 consecutive normal Pap tests.  HPV screening.** / Every 3 years from ages 37 years through ages 3 to 3 years with a history of 3 consecutive normal Pap tests.  Fecal occult blood test (FOBT) of stool. / Every year beginning at age 40 years and continuing until age 50 years. You may  not need to do this test if you get a colonoscopy every 10 years.  Flexible sigmoidoscopy or colonoscopy.** / Every 5 years for a flexible sigmoidoscopy or every 10 years for a colonoscopy beginning at age 56 years and continuing until age 6 years.  Hepatitis C blood test.** / For all people born from [birth year] through 1964 and any individual with known risks for hepatitis C.  Skin self-exam. / Monthly.  Influenza vaccine. / Every year.  Tetanus, diphtheria, and acellular pertussis (Tdap/Td) vaccine.** / Consult your health care provider. Pregnant women should receive 1 dose of Tdap vaccine during each pregnancy. 1 dose of Td every 10 years.  Varicella vaccine.** / Consult your health care provider. Pregnant females who do not have evidence of immunity should receive the first dose after pregnancy.  Zoster vaccine.** / 1 dose for adults aged 41 years or older.  Measles, mumps, rubella (MMR) vaccine.** / You need at least 1 dose of MMR if you were born in 1957 or later. You may also need a 2nd dose. For females of childbearing age, rubella immunity should be determined. If there is no evidence of immunity, females who are not pregnant should be vaccinated. If there is no evidence of immunity, females who are pregnant should delay immunization until after pregnancy.  Pneumococcal 13-valent conjugate (PCV13) vaccine.** / Consult your health care provider.  Pneumococcal polysaccharide (PPSV23)  vaccine.** / 1 to 2 doses if you smoke cigarettes or if you have certain conditions.  Meningococcal vaccine.** / Consult your health care provider.  Hepatitis A vaccine.** / Consult your health care provider.  Hepatitis B vaccine.** / Consult your health care provider.  Haemophilus influenzae type b (Hib) vaccine.** / Consult your health care provider. Ages 62 years and over  Blood pressure check.** / Every 1 to 2 years.  Lipid and cholesterol check.** / Every 5 years beginning at age 64 years.  Lung  cancer screening. / Every year if you are aged 20-80 years and have a 30-pack-year history of smoking and currently smoke or have quit within the past 15 years. Yearly screening is stopped once you have quit smoking for at least 15 years or develop a health problem that would prevent you from having lung cancer treatment.  Clinical breast exam.** / Every year after age 63 years.  BRCA-related cancer risk assessment.** / For women who have family members with a BRCA-related cancer (breast, ovarian, tubal, or peritoneal cancers).  Mammogram.** / Every year beginning at age 40 years and continuing for as long as you are in good health. Consult with your health care provider.  Pap test.** / Every 3 years starting at age 78 years through age 54 or 9 years with 3 consecutive normal Pap tests. Testing can be stopped between 65 and 70 years with 3 consecutive normal Pap tests and no abnormal Pap or HPV tests in the past 10 years.  HPV screening.** / Every 3 years from ages 39 years through ages 34 or 52 years with a history of 3 consecutive normal Pap tests. Testing can be stopped between 65 and 70 years with 3 consecutive normal Pap tests and no abnormal Pap or HPV tests in the past 10 years.  Fecal occult blood test (FOBT) of stool. / Every year beginning at age 56 years and continuing until age 31 years. You may not need to do this test if you get a colonoscopy every 10 years.  Flexible sigmoidoscopy or colonoscopy.** / Every 5 years for a flexible sigmoidoscopy or every 10 years for a colonoscopy beginning at age 64 years and continuing until age 1 years.  Hepatitis C blood test.** / For all people born from [birth year] through 1964 and any individual with known risks for hepatitis C.  Osteoporosis screening.** / A one-time screening for women ages 19 years and over and women at risk for fractures or osteoporosis.  Skin self-exam. / Monthly.  Influenza vaccine. / Every year.  Tetanus, diphtheria, and  acellular pertussis (Tdap/Td) vaccine.** / 1 dose of Td every 10 years.  Varicella vaccine.** / Consult your health care provider.  Zoster vaccine.** / 1 dose for adults aged 66 years or older.  Pneumococcal 13-valent conjugate (PCV13) vaccine.** / Consult your health care provider.  Pneumococcal polysaccharide (PPSV23) vaccine.** / 1 dose for all adults aged 74 years and older.  Meningococcal vaccine.** / Consult your health care provider.  Hepatitis A vaccine.** / Consult your health care provider.  Hepatitis B vaccine.** / Consult your health care provider.  Haemophilus influenzae type b (Hib) vaccine.** / Consult your health care provider. ** Family history and personal history of risk and conditions may change your health care provider's recommendations. Document Released: 03/05/2001 Document Revised: 05/24/2013 Document Reviewed: 06/04/2010 [facility] Patient Information 2015 [place], [place]. This information is not intended to replace advice given to you by your health care provider. Make sure you discuss any questions you  have with your health care provider.

## 2014-04-08 NOTE — Progress Notes (Signed)
Pre visit review using our clinic review tool, if applicable. No additional management support is needed unless otherwise documented below in the visit note. 

## 2014-04-09 LAB — HIV ANTIBODY (ROUTINE TESTING W REFLEX): HIV 1&2 Ab, 4th Generation: NONREACTIVE

## 2014-04-21 ENCOUNTER — Encounter: Payer: Self-pay | Admitting: Family Medicine

## 2014-04-21 DIAGNOSIS — H547 Unspecified visual loss: Secondary | ICD-10-CM

## 2014-04-21 HISTORY — DX: Unspecified visual loss: H54.7

## 2014-04-21 NOTE — Progress Notes (Signed)
Anne Huffman  [id number] 1965/10/17 04/21/2014      Progress Note-Follow Up  Subjective  Chief Complaint  Chief Complaint  Patient presents with  . Annual Exam    HPI  Patient is a 49 y.o. female in today for routine medical care. Patient is in today for annual exam. Doing well. No recent illness. No acute complaints. Has not proceeded with mammogram but offers no concerns regarding breast changes. Denies CP/palp/SOB/HA/congestion/fevers/GI or GU c/o. Taking meds as prescribed  Past Medical History  Diagnosis Date  . Asthma     since childhood  . History of chicken pox     childhood  . Depression     no treatment  . Ulcer     stomach  . Seasonal allergies   . Fatigue 08/20/2012  . Mild intermittent asthma 03/11/2011  . Preventative health care 09/21/2012  . H. pylori infection 06/09/2013  . Decreased visual acuity 04/21/2014    Past Surgical History  Procedure Laterality Date  . No past surgeries  03/11/2011    Family History  Problem Relation Age of Onset  . Breast cancer Maternal Grandmother   . Cancer Maternal Grandmother     breast  . Hypertension Mother     maternal aunt  . Eczema Mother   . Prostate cancer Neg Hx   . Colon cancer Neg Hx   . Heart disease Neg Hx   . Diabetes Neg Hx   . Depression Neg Hx   . Stroke Neg Hx   . Eczema Sister   . Eczema Sister   . Cancer Other     breast cancer    History   Social History  . Marital Status: Single    Spouse Name: N/A  . Number of Children: N/A  . Years of Education: N/A   Occupational History  . Not on file.   Social History Main Topics  . Smoking status: Never Smoker   . Smokeless tobacco: Never Used  . Alcohol Use: No  . Drug Use: No  . 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 Not on file   Social History Narrative    Current Outpatient Prescriptions on File Prior to Visit    Medication Sig Dispense Refill  . Calcium Citrate-Vitamin D 250-200 MG-UNIT TABS Take 1 tablet by mouth 2 (two) times daily. (Patient not taking: Reported on 04/08/2014) 60 each 11  . ranitidine (ZANTAC) 300 MG tablet Take 1 tablet (300 mg total) by mouth at bedtime as needed for heartburn. (Patient not taking: Reported on 04/08/2014) 30 tablet 5   No current facility-administered medications on file prior to visit.    No Known Allergies  Review of Systems  Review of Systems  Constitutional: Negative for fever, chills and malaise/fatigue.  HENT: Negative for congestion, hearing loss and nosebleeds.   Eyes: Negative for discharge.  Respiratory: Negative for cough, sputum production, shortness of breath and wheezing.   Cardiovascular: Negative for chest pain, palpitations and leg swelling.  Gastrointestinal: Negative for heartburn, nausea, vomiting, abdominal pain, diarrhea, constipation and blood in stool.  Genitourinary: Negative for dysuria, urgency, frequency and hematuria.  Musculoskeletal: Negative for myalgias, back pain and falls.  Skin: Negative for rash.  Neurological: Negative for dizziness, tremors, sensory change, focal weakness, loss of consciousness, weakness and headaches.  Endo/Heme/Allergies: Negative for polydipsia. Does not bruise/bleed easily.  Psychiatric/Behavioral:  Negative for depression and suicidal ideas. The patient is not nervous/anxious and does not have insomnia.     Objective  BP 130/86 mmHg  Pulse 70  Temp(Src) 97.7 F (36.5 C) (Oral)  Ht 5\' 3"  (1.6 m)  Wt 154 lb 4 oz (69.967 kg)  BMI 27.33 kg/m2  SpO2 98%  Physical Exam  Physical Exam  Constitutional: She is oriented to person, place, and time and well-developed, well-nourished, and in no distress. No distress.  HENT:  Head: Normocephalic and atraumatic.  Right Ear: External ear normal.  Left Ear: External ear normal.  Nose: Nose normal.  Mouth/Throat: Oropharynx is clear and moist. No  oropharyngeal exudate.  Eyes: Conjunctivae are normal. Pupils are equal, round, and reactive to light. Right eye exhibits no discharge. Left eye exhibits no discharge. No scleral icterus.  Neck: Normal range of motion. Neck supple. No thyromegaly present.  Cardiovascular: Normal rate, regular rhythm, normal heart sounds and intact distal pulses.   No murmur heard. Pulmonary/Chest: Effort normal and breath sounds normal. No respiratory distress. She has no wheezes. She has no rales.  Abdominal: Soft. Bowel sounds are normal. She exhibits no distension and no mass. There is no tenderness.  Musculoskeletal: Normal range of motion. She exhibits no edema or tenderness.  Lymphadenopathy:    She has no cervical adenopathy.  Neurological: She is alert and oriented to person, place, and time. She has normal reflexes. No cranial nerve deficit. Coordination normal.  Skin: Skin is warm and dry. No rash noted. She is not diaphoretic.  Psychiatric: Mood, memory and affect normal.    Lab Results  Component Value Date   TSH 1.11 04/08/2014   Lab Results  Component Value Date   WBC 5.5 04/08/2014   HGB 12.6 04/08/2014   HCT 37.5 04/08/2014   MCV 86.5 04/08/2014   PLT 310.0 04/08/2014   Lab Results  Component Value Date   CREATININE 0.70 04/08/2014   BUN 15 04/08/2014   NA 139 04/08/2014   K 4.0 04/08/2014   CL 105 04/08/2014   CO2 30 04/08/2014   Lab Results  Component Value Date   ALT 10 04/08/2014   AST 12 04/08/2014   ALKPHOS 88 04/08/2014   BILITOT 0.4 04/08/2014   Lab Results  Component Value Date   CHOL 187 04/08/2014   Lab Results  Component Value Date   HDL 82.80 04/08/2014   Lab Results  Component Value Date   LDLCALC 98 04/08/2014   Lab Results  Component Value Date   TRIG 31.0 04/08/2014   Lab Results  Component Value Date   CHOLHDL 2 04/08/2014     Assessment & Plan  GERD (gastroesophageal reflux disease) Avoid offending foods, start probiotics. Do not  eat large meals in late evening and consider raising head of bed. Given an rx for Ranitidine to use prn, report worsening symptoms   Preventative health care Patient encouraged to maintain heart healthy diet, regular exercise, adequate sleep. Consider daily probiotics. Take medications as prescribed. Pap not due til 2017. Encouraged to proceed with 3D MGM   Decreased visual acuity Will call if she would like [place] to place a referral, no recent acute changes.

## 2014-04-21 NOTE — Assessment & Plan Note (Signed)
Patient encouraged to maintain heart healthy diet, regular exercise, adequate sleep. Consider daily probiotics. Take medications as prescribed. Pap not due til 2017. Encouraged to proceed with 3D MGM

## 2014-04-21 NOTE — Assessment & Plan Note (Signed)
Will call if she would like [place] to place a referral, no recent acute changes.

## 2014-04-21 NOTE — Assessment & Plan Note (Signed)
Avoid offending foods, start probiotics. Do not eat large meals in late evening and consider raising head of bed. Given an rx for Ranitidine to use prn, report worsening symptoms

## 2014-04-26 ENCOUNTER — Ambulatory Visit (INDEPENDENT_AMBULATORY_CARE_PROVIDER_SITE_OTHER): Payer: BLUE CROSS/BLUE SHIELD | Admitting: Internal Medicine

## 2014-04-26 VITALS — BP 110/68 | HR 75 | Temp 98.5°F | Resp 16 | Ht 63.0 in | Wt 162.0 lb

## 2014-04-26 DIAGNOSIS — Z111 Encounter for screening for respiratory tuberculosis: Secondary | ICD-10-CM

## 2014-04-26 DIAGNOSIS — Z7189 Other specified counseling: Secondary | ICD-10-CM | POA: Diagnosis not present

## 2014-04-26 DIAGNOSIS — Z719 Counseling, unspecified: Secondary | ICD-10-CM

## 2014-04-26 NOTE — Progress Notes (Signed)
   Subjective:    Patient ID: Anne Huffman, female    DOB: 07/17/1965, 49 y.o.   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 Is UTD for immunizations. Had cpe and tests in March. No hx of positive PPD   Review of Systems     Objective:   Physical Exam  Constitutional: She is oriented to person, place, and time. She appears well-developed and well-nourished. No distress.  HENT:  Head: Normocephalic.  Eyes: EOM are normal. No scleral icterus.  Neck: Normal range of motion.  Pulmonary/Chest: Effort normal.  Musculoskeletal: Normal range of motion.  Neurological: She is alert and oriented to person, place, and time. She exhibits normal muscle tone. Coordination normal.  Psychiatric: She has a normal mood and affect.  Vitals reviewed.         Assessment & Plan:  PPD applied Consider Hep A and Shingles vaccine

## 2014-04-26 NOTE — Progress Notes (Signed)

## 2014-04-26 NOTE — Patient Instructions (Signed)
Tuberculin Skin Test The PPD skin test is a method used to help with the diagnosis of a disease called tuberculosis (TB). HOW THE TEST IS DONE  The test site (usually the forearm) is cleansed. The PPD extract is then injected under the top layer of skin, causing a blister to form on the skin. The reaction will take 48 - 72 hours to develop. You must return to your health care provider within that time to have the area checked. This will determine whether you have had a significant reaction to the PPD test. A reaction is measured in millimeters of hard swelling (induration) at the site. PREPARATION FOR TEST  There is no special preparation for this test. People with a skin rash or other skin irritations on their arms may need to have the test performed at a different spot on the body. Tell your health care provider if you have ever had a positive PPD skin test. If so, you should not have a repeat PPD test. Tell your doctor if you have a medical condition or if you take certain drugs, such as steroids, that can affect your immune system. These situations may lead to inaccurate test results. NORMAL FINDINGS A negative reaction (no induration) or a level of hard swelling that falls below a certain cutoff may mean that a person has not been infected with the bacteria that cause TB. There are different cutoffs for children, people with HIV, and other risk groups. Unfortunately, this is not a perfect test, and up to 20% of people infected with tuberculosis may not have a reaction on the PPD skin test. In addition, certain conditions that affect the immune system (cancer, recent chemotherapy, late-stage AIDS) may cause a false-negative test result.  The reaction will take 48 - 72 hours to develop. You must return to your health care provider within that time to have the area checked. Follow your caregiver's instructions as to where and when to report for this to be done. Ranges for normal findings may vary  among different laboratories and hospitals. You should always check with your doctor after having lab work or other tests done to discuss the meaning of your test results and whether your values are considered within normal limits. WHAT ABNORMAL RESULTS MEAN  The results of the test depend on the size of the skin reaction and on the person being tested.  A small reaction (5 mm of hard swelling at the site) is considered to be positive in people who have HIV, who are taking steroid therapy, or who have been in close contact with a person who has active tuberculosis. Larger reactions (greater than or equal to 10 mm) are considered positive in people with diabetes or kidney failure, and in health care workers, among others. In people with no known risks for tuberculosis, a positive reaction requires 15 mm or more of hard swelling at the site. RISKS AND COMPLICATIONS There is a very small risk of severe redness and swelling of the arm in people who have had a previous positive PPD test and who have the test again. There also have been a few rare cases of this reaction in people who have not been tested before. CONSIDERATIONS  A positive skin test does not necessarily mean that a person has active tuberculosis. More tests will be done to check whether active disease is present. Many people who were born outside the [place] may have had a vaccine called "BCG," which can lead to a false-positive test  result. MEANING OF TEST  Your caregiver will go over the test results with you and discuss the importance and meaning of your results, as well as treatment options and the need for additional tests if necessary. OBTAINING THE TEST RESULTS It is your responsibility to obtain your test results. Ask the lab or department performing the test when and how you will get your results. Document Released: 10/17/2004 Document Revised: 04/01/2011 Document Reviewed: 04/16/2013 [facility] Patient Information 2015  [place], [place]. This information is not intended to replace advice given to you by your health care provider. Make sure you discuss any questions you have with your health care provider. Immunization Schedule, Adult  Influenza vaccine.  All adults should be immunized every year.  All adults, including pregnant women and people with hives-only allergy to eggs can receive the inactivated influenza (IIV) vaccine.  Adults aged 18-49 years can receive the recombinant influenza (RIV) vaccine. The RIV vaccine does not contain any egg protein.  Adults aged 60 years or older can receive the standard-dose IIV or the high-dose IIV.  Tetanus, diphtheria, and acellular pertussis (Td, Tdap) vaccine.  Pregnant women should receive 1 dose of Tdap vaccine during each pregnancy. The dose should be obtained regardless of the length of time since the last dose. Immunization is preferred during the 27th to 36th week of gestation.  An adult who has not previously received Tdap or who does not know his or her vaccine status should receive 1 dose of Tdap. This initial dose should be followed by tetanus and diphtheria toxoids (Td) booster doses every 10 years.  Adults with an unknown or incomplete history of completing a 3-dose immunization series with Td-containing vaccines should begin or complete a primary immunization series including a Tdap dose.  Adults should receive a Td booster every 10 years.  Varicella vaccine.  An adult without evidence of immunity to varicella should receive 2 doses or a second dose if he or she has previously received 1 dose.  Pregnant females who do not have evidence of immunity should receive the first dose after pregnancy. This first dose should be obtained before leaving the health care facility. The second dose should be obtained 4-8 weeks after the first dose.  Human papillomavirus (HPV) vaccine.  Females aged 13-26 years who have not received the vaccine previously should  obtain the 3-dose series.  The vaccine is not recommended for use in pregnant females. However, pregnancy testing is not needed before receiving a dose. If a female is found to be pregnant after receiving a dose, no treatment is needed. In that case, the remaining doses should be delayed until after the pregnancy.  Males aged 4-21 years who have not received the vaccine previously should receive the 3-dose series. Males aged 22-26 years may be immunized.  Immunization is recommended through the age of 31 years for any female who has sex with males and did not get any or all doses earlier.  Immunization is recommended for any person with an immunocompromised condition through the age of 59 years if he or she did not get any or all doses earlier.  During the 3-dose series, the second dose should be obtained 4-8 weeks after the first dose. The third dose should be obtained 24 weeks after the first dose and 16 weeks after the second dose.  Zoster vaccine.  One dose is recommended for adults aged 6 years or older unless certain conditions are present.  Measles, mumps, and rubella (MMR) vaccine.  Adults born  before 1957 generally are considered immune to measles and mumps.  Adults born in [birth year] or later should have 1 or more doses of MMR vaccine unless there is a contraindication to the vaccine or there is laboratory evidence of immunity to each of the three diseases.  A routine second dose of MMR vaccine should be obtained at least 28 days after the first dose for students attending postsecondary schools, health care workers, or international travelers.  People who received inactivated measles vaccine or an unknown type of measles vaccine during 1963-1967 should receive 2 doses of MMR vaccine.  People who received inactivated mumps vaccine or an unknown type of mumps vaccine before 1979 and are at high risk for mumps infection should consider immunization with 2 doses of MMR vaccine.  For  females of childbearing age, rubella immunity should be determined. If there is no evidence of immunity, females who are not pregnant should be vaccinated. If there is no evidence of immunity, females who are pregnant should delay immunization until after pregnancy.  Unvaccinated health care workers born before [birth year] who lack laboratory evidence of measles, mumps, or rubella immunity or laboratory confirmation of disease should consider measles and mumps immunization with 2 doses of MMR vaccine or rubella immunization with 1 dose of MMR vaccine.  Pneumococcal 13-valent conjugate (PCV13) vaccine.  When indicated, a person who is uncertain of his or her immunization history and has no record of immunization should receive the PCV13 vaccine.  An adult aged 49 years or older who has certain medical conditions and has not been previously immunized should receive 1 dose of PCV13 vaccine. This PCV13 should be followed with a dose of pneumococcal polysaccharide (PPSV23) vaccine. The PPSV23 vaccine dose should be obtained at least 8 weeks after the dose of PCV13 vaccine.  An adult aged 33 years or older who has certain medical conditions and previously received 1 or more doses of PPSV23 vaccine should receive 1 dose of PCV13. The PCV13 vaccine dose should be obtained 1 or more years after the last PPSV23 vaccine dose.  Pneumococcal polysaccharide (PPSV23) vaccine.  When PCV13 is also indicated, PCV13 should be obtained first.  All adults aged 46 years and older should be immunized.  An adult younger than age 34 years who has certain medical conditions should be immunized.  Any person who resides in a nursing home or long-term care facility should be immunized.  An adult smoker should be immunized.  People with an immunocompromised condition and certain other conditions should receive both PCV13 and PPSV23 vaccines.  People with human immunodeficiency virus (HIV) infection should be immunized as soon  as possible after diagnosis.  Immunization during chemotherapy or radiation therapy should be avoided.  Routine use of PPSV23 vaccine is not recommended for American Indians, [place] Natives, or people younger than 65 years unless there are medical conditions that require PPSV23 vaccine.  When indicated, people who have unknown immunization and have no record of immunization should receive PPSV23 vaccine.  One-time revaccination 5 years after the first dose of PPSV23 is recommended for people aged 19-64 years who have chronic kidney failure, nephrotic syndrome, asplenia, or immunocompromised conditions.  People who received 1-2 doses of PPSV23 before age 82 years should receive another dose of PPSV23 vaccine at age 28 years or later if at least 5 years have passed since the previous dose.  Doses of PPSV23 are not needed for people immunized with PPSV23 at or after age 21 years.  Meningococcal vaccine.  Adults  with asplenia or persistent complement component deficiencies should receive 2 doses of quadrivalent meningococcal conjugate (MenACWY-D) vaccine. The doses should be obtained at least 2 months apart.  Microbiologists working with certain meningococcal bacteria, [employer] recruits, people at risk during an outbreak, and people who travel to or live in countries with a high rate of meningitis should be immunized.  A first-year college student up through age 68 years who is living in a residence hall should receive a dose if he or she did not receive a dose on or after his or her 16th birthday.  Adults who have certain high-risk conditions should receive one or more doses of vaccine.  Hepatitis A vaccine.  Adults who wish to be protected from this disease, have certain high-risk conditions, work with hepatitis A-infected animals, work in hepatitis A research labs, or travel to or work in countries with a high rate of hepatitis A should be immunized.  Adults who were previously  unvaccinated and who anticipate close contact with an international adoptee during the first 60 days after arrival in the [place] from a country with a high rate of hepatitis A should be immunized.  Hepatitis B vaccine.  Adults who wish to be protected from this disease, have certain high-risk conditions, may be exposed to blood or other infectious body fluids, are household contacts or sex partners of hepatitis B positive people, are clients or workers in certain care facilities, or travel to or work in countries with a high rate of hepatitis B should be immunized.  Haemophilus influenzae type b (Hib) vaccine.  A previously unvaccinated person with asplenia or sickle cell disease or having a scheduled splenectomy should receive 1 dose of Hib vaccine.  Regardless of previous immunization, a recipient of a hematopoietic stem cell transplant should receive a 3-dose series 6-12 months after his or her successful transplant.  Hib vaccine is not recommended for adults with HIV infection. Document Released: 03/30/2003 Document Revised: 05/04/2012 Document Reviewed: 02/25/2012 [facility] Patient Information 2015 [place], [place]. This information is not intended to replace advice given to you by your health care provider. Make sure you discuss any questions you have with your health care provider.

## 2014-04-29 ENCOUNTER — Ambulatory Visit (INDEPENDENT_AMBULATORY_CARE_PROVIDER_SITE_OTHER): Payer: BLUE CROSS/BLUE SHIELD | Admitting: Radiology

## 2014-04-29 DIAGNOSIS — Z111 Encounter for screening for respiratory tuberculosis: Secondary | ICD-10-CM

## 2014-04-29 LAB — TB SKIN TEST
Induration: 0 mm
TB Skin Test: NEGATIVE

## 2014-07-14 ENCOUNTER — Encounter: Payer: Self-pay | Admitting: Family Medicine

## 2014-07-14 LAB — HM MAMMOGRAPHY

## 2014-08-05 ENCOUNTER — Emergency Department (HOSPITAL_COMMUNITY)
Admission: EM | Admit: 2014-08-05 | Discharge: 2014-08-05 | Disposition: A | Discharge status: Home / Self Care | Payer: BLUE CROSS/BLUE SHIELD | Attending: Emergency Medicine | Admitting: Emergency Medicine

## 2014-08-05 ENCOUNTER — Encounter (HOSPITAL_COMMUNITY): Payer: Self-pay | Admitting: Emergency Medicine

## 2014-08-05 ENCOUNTER — Telehealth: Payer: Self-pay | Admitting: Family Medicine

## 2014-08-05 ENCOUNTER — Emergency Department (HOSPITAL_COMMUNITY): Payer: BLUE CROSS/BLUE SHIELD

## 2014-08-05 DIAGNOSIS — Z8659 Personal history of other mental and behavioral disorders: Secondary | ICD-10-CM | POA: Insufficient documentation

## 2014-08-05 DIAGNOSIS — J4521 Mild intermittent asthma with (acute) exacerbation: Secondary | ICD-10-CM | POA: Insufficient documentation

## 2014-08-05 DIAGNOSIS — R079 Chest pain, unspecified: Secondary | ICD-10-CM | POA: Diagnosis present

## 2014-08-05 DIAGNOSIS — Z8669 Personal history of other diseases of the nervous system and sense organs: Secondary | ICD-10-CM | POA: Insufficient documentation

## 2014-08-05 DIAGNOSIS — Z8619 Personal history of other infectious and parasitic diseases: Secondary | ICD-10-CM | POA: Diagnosis not present

## 2014-08-05 DIAGNOSIS — R0789 Other chest pain: Secondary | ICD-10-CM | POA: Diagnosis not present

## 2014-08-05 DIAGNOSIS — Z872 Personal history of diseases of the skin and subcutaneous tissue: Secondary | ICD-10-CM | POA: Insufficient documentation

## 2014-08-05 LAB — I-STAT TROPONIN, ED
Troponin i, poc: 0.01 ng/mL (ref 0.00–0.08)
Troponin i, poc: 0 ng/mL (ref 0.00–0.08)

## 2014-08-05 LAB — BASIC METABOLIC PANEL
Anion gap: 7 (ref 5–15)
BUN: 10 mg/dL (ref 6–20)
CO2: 24 mmol/L (ref 22–32)
Calcium: 8.8 mg/dL — ABNORMAL LOW (ref 8.9–10.3)
Chloride: 106 mmol/L (ref 101–111)
Creatinine, Ser: 0.67 mg/dL (ref 0.44–1.00)
GFR calc Af Amer: >60 mL/min (ref >60)
GFR calc non Af Amer: >60 mL/min (ref >60)
Glucose, Bld: 91 mg/dL (ref 65–99)
Potassium: 4 mmol/L (ref 3.5–5.1)
Sodium: 137 mmol/L (ref 135–145)

## 2014-08-05 LAB — TROPONIN I: Troponin I: 0.03 ng/mL (ref <0.031)

## 2014-08-05 LAB — CBC
HCT: 37.5 % (ref 36.0–46.0)
Hemoglobin: 12.4 g/dL (ref 12.0–15.0)
MCH: 28.3 pg (ref 26.0–34.0)
MCHC: 33.1 g/dL (ref 30.0–36.0)
MCV: 85.6 fL (ref 78.0–100.0)
Platelets: 303 10*3/uL (ref 150–400)
RBC: 4.38 MIL/uL (ref 3.87–5.11)
RDW: 14.4 % (ref 11.5–15.5)
WBC: 7.1 10*3/uL (ref 4.0–10.5)

## 2014-08-05 MED ORDER — ASPIRIN 81 MG PO CHEW
324.0000 mg | CHEWABLE_TABLET | Freq: Once | ORAL | Status: AC
  Administered 2014-08-05: 324 mg via ORAL
  Filled 2014-08-05: qty 4

## 2014-08-05 NOTE — Telephone Encounter (Signed)
[facility] - Client TELEPHONE ADVICE RECORD [facility]  Patient Name: ROSELLE NORTON  DOB: Oct 03, 1965    Initial Comment Caller states she has chest pain   Nurse Assessment  Nurse: Genoveva Ill, RN, Lattie Haw Date/Time (Eastern Time): 08/05/2014 3:59:38 PM  Confirm and document reason for call. If symptomatic, describe symptoms. ---Caller states she has had mild chest pain on rt side off and on since day before yesterday, now has mild ache pain above breast and below collar bone, but today has left chest pain in same area as well for for first time today  Has the patient traveled out of the country within the last 30 days? ---Not Applicable  Does the patient require triage? ---Yes  Related visit to physician within the last 2 weeks? ---No  Does the PT have any chronic conditions? (i.e. diabetes, asthma, etc.) ---Yes  List chronic conditions. ---no chronic medical conditions  Did the patient indicate they were pregnant? ---No     Guidelines    Guideline Title Affirmed Question Affirmed Notes  Chest Pain [1] Chest pain lasts > 5 minutes AND [2] described as crushing, pressure-like, or heavy    Final Disposition User   Call EMS [address], RN, [facility] - ED   Disagree/Comply: Disagree  Disagree/Comply Reason: Disagree with instructions  [facility] - Client [place] [facility]  Patient Name: EPHRATA VERVILLE  DOB: 01-19-1966    Initial Comment Caller states she has chest pain   Nurse Assessment  Nurse: Genoveva Ill, RN, Lattie Haw Date/Time (Eastern Time): 08/05/2014 3:59:38 PM  Confirm and document reason for call. If symptomatic, describe symptoms. ---Caller states she has had mild chest pain on rt side off and on since day before yesterday, now has mild ache pain above breast and below collar bone, but today has left chest pain in same area as well for for first time  today  Has the patient traveled out of the country within the last 30 days? ---Not Applicable  Does the patient require triage? ---Yes  Related visit to physician within the last 2 weeks? ---No  Does the PT have any chronic conditions? (i.e. diabetes, asthma, etc.) ---Yes  List chronic conditions. ---no chronic medical conditions  Did the patient indicate they were pregnant? ---No     Guidelines    Guideline Title Affirmed Question Affirmed Notes  Chest Pain [1] Chest pain lasts > 5 minutes AND [2] described as crushing, pressure-like, or heavy    Final Disposition User   Call EMS [address]ress, RN, [facility] - ED   Disagree/Comply: Disagree  Disagree/Comply Reason: Disagree with instructions  Pt states someone will take her to [facility] now

## 2014-08-05 NOTE — ED Provider Notes (Signed)
CSN: [id number]     Arrival date & time 08/05/14  1713 History   First MD Initiated Contact with Patient 08/05/14 1746     Chief Complaint  Patient presents with  . Chest Pain    The patient said she started having chest pain at about three thirty.  She denies any other symptoms with it other than "swimmy headed".      (Consider location/radiation/quality/duration/timing/severity/associated sxs/prior Treatment) Patient is a 49 y.o. female presenting with chest pain. The history is provided by the patient. No language interpreter was used.  Chest Pain Pain location:  Substernal area Pain quality: aching and pressure   Pain radiates to:  Does not radiate Pain radiates to the back: no   Pain severity:  Mild Onset quality:  Sudden Timing:  Constant Progression:  Waxing and waning Chronicity:  New Context: at rest   Relieved by:  Nothing Worsened by:  Nothing tried Ineffective treatments:  None tried Associated symptoms: shortness of breath   Associated symptoms: no abdominal pain, no altered mental status, no anorexia, no anxiety, no back pain, no cough, no diaphoresis, no dizziness, no fatigue, no fever, no headache, no lower extremity edema, no nausea, no palpitations, not vomiting and no weakness   Risk factors: no coronary artery disease, no diabetes mellitus, no hypertension, not female, not obese, no prior DVT/PE and no smoking     Past Medical History  Diagnosis Date  . Asthma     since childhood  . History of chicken pox     childhood  . Depression     no treatment  . Ulcer     stomach  . Seasonal allergies   . Fatigue 08/20/2012  . Mild intermittent asthma 03/11/2011  . Preventative health care 09/21/2012  . H. pylori infection 06/09/2013  . Decreased visual acuity 04/21/2014  . Allergy    Past Surgical History  Procedure Laterality Date  . No past surgeries  03/11/2011   Family History  Problem Relation Age of Onset  . Breast cancer Maternal Grandmother   . Cancer  Maternal Grandmother     breast  . Hypertension Mother     maternal aunt  . Eczema Mother   . Prostate cancer Neg Hx   . Colon cancer Neg Hx   . Heart disease Neg Hx   . Diabetes Neg Hx   . Depression Neg Hx   . Stroke Neg Hx   . Eczema Sister   . Eczema Sister   . Cancer Other     breast cancer   History  Substance Use Topics  . Smoking status: Never Smoker   . Smokeless tobacco: Never Used  . Alcohol Use: No   OB History    No data available     Review of Systems  Constitutional: Negative for fever, diaphoresis and fatigue.  Respiratory: Positive for shortness of breath. Negative for cough and chest tightness.   Cardiovascular: Positive for chest pain. Negative for palpitations.  Gastrointestinal: Negative for nausea, vomiting, abdominal pain and anorexia.  Musculoskeletal: Negative for back pain.  Neurological: Negative for dizziness, speech difficulty, weakness, light-headedness and headaches.  Psychiatric/Behavioral: Negative for confusion.  All other systems reviewed and are negative.   Allergies  Peanuts  Home Medications   Prior to Admission medications   Not on File   BP 133/71 mmHg  Pulse 82  Temp(Src) 98.4 F (36.9 C) (Oral)  Resp 18  Ht 5\' 3"  (1.6 m)  Wt 176 lb (79.833 kg)  BMI 31.18 kg/m2  SpO2 97%  LMP 06/05/2014 Physical Exam  Constitutional: She is oriented to person, place, and time. Vital signs are normal. She appears well-developed and well-nourished. She does not appear ill. No distress.  HENT:  Head: Normocephalic and atraumatic.  Nose: Nose normal.  Mouth/Throat: Oropharynx is clear and moist. No oropharyngeal exudate.  Eyes: EOM are normal. Pupils are equal, round, and reactive to light.  Neck: Normal range of motion. Neck supple.  Cardiovascular: Normal rate, regular rhythm, normal heart sounds and intact distal pulses.   No murmur heard. Pulmonary/Chest: Effort normal and breath sounds normal. No respiratory distress. She has  no wheezes. She exhibits tenderness (mild reproducible left anterior chest tenderness).  Abdominal: Soft. There is no tenderness. There is no rebound and no guarding.  Musculoskeletal: Normal range of motion. She exhibits no tenderness.  Lymphadenopathy:    She has no cervical adenopathy.  Neurological: She is alert and oriented to person, place, and time. No cranial nerve deficit. Coordination normal.  Skin: Skin is warm and dry. She is not diaphoretic.  Psychiatric: She has a normal mood and affect. Her behavior is normal. Judgment and thought content normal.  Nursing note and vitals reviewed.   ED Course  Procedures (including critical care time) Labs Review Labs Reviewed  BASIC METABOLIC PANEL - Abnormal; Notable for the following:    Calcium 8.8 (*)    All other components within normal limits  CBC  TROPONIN I  I-STAT TROPOININ, ED  Randolm Idol, ED   Imaging Review Dg Chest 2 View  08/05/2014   CLINICAL DATA:  Chest pain.  History of asthma.  EXAM: CHEST  2 VIEW  COMPARISON:  04/15/2011  FINDINGS: The heart size and mediastinal contours are within normal limits. Similar appearance of small upper lobe nodules. No pulmonary edema or airspace consolidation. The visualized skeletal structures are unremarkable.  IMPRESSION: No active cardiopulmonary disease.   Electronically Signed   By: Kerby Moors M.D.   On: 08/05/2014 18:45     EKG Interpretation None      MDM   Final diagnoses:  Chest pain, unspecified chest pain type   Pt is a 49 yo with no PMH who presents with chest pain.  Complains of left anterior chest pressure this afternoon around 1500.  The pain was associated with SOB, but no nausea, vomiting, diaphoresis, or lightheadedness.  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  Sensation feels like pain/pressure but is also reproducible on palpation.  No known PMH.  No family hx of CAD in people < 69 yo.  No tobacco use.   No hx of clots in pt or family, no recent immobilization, or surgeries, and PERC negative.   EKG: NSR at 77 bpm, QTc 416, no ST elevations/depressions, no T wave abnormalities.  Given ASA 324 and will send cardiac biomarkers and trend.  CXR with no acute pathology.    2 x trops negative, so ACS appropriately ruled out in his low risk patient.  Considered stable for discharge home.  Advised no heavy lifting, NSAIDs PRN for pain.  Encouraged to follow up with PCP if pain continues.  All questions were answered and ED return precautions were discussed prior to dc home in good condition.    If performed, labs, EKGs, and imaging were reviewed and interpreted by myself and my attending, and incorporated in the medical decision making.  Patient was seen with ED Attending, Dr. Tamera Punt  Tori Milks, MD   Tori Milks, MD 08/06/14 [id number]  Malvin Johns, MD 08/06/14 [phone]

## 2014-08-05 NOTE — Telephone Encounter (Signed)
Noted  

## 2014-08-05 NOTE — Telephone Encounter (Signed)
Patient called in complaining of chest pain. Was speaking to a rep at [facility] and patient disconnected call. Rep at [facility] states that he will call patient.

## 2014-08-05 NOTE — Discharge Instructions (Signed)

## 2014-08-05 NOTE — ED Notes (Signed)
Troponin added on .

## 2014-08-05 NOTE — ED Notes (Signed)
The patient said she started having chest pain at about three thirty.  She denies any other symptoms with it other than "swimmy headed".    The patient denies any injury to her chest.  She rates her pain 5/10.

## 2014-12-26 ENCOUNTER — Encounter (HOSPITAL_BASED_OUTPATIENT_CLINIC_OR_DEPARTMENT_OTHER): Payer: Self-pay | Admitting: *Deleted

## 2014-12-26 ENCOUNTER — Emergency Department (HOSPITAL_BASED_OUTPATIENT_CLINIC_OR_DEPARTMENT_OTHER): Payer: BLUE CROSS/BLUE SHIELD

## 2014-12-26 ENCOUNTER — Emergency Department (HOSPITAL_BASED_OUTPATIENT_CLINIC_OR_DEPARTMENT_OTHER)
Admission: EM | Admit: 2014-12-26 | Discharge: 2014-12-26 | Disposition: A | Discharge status: Home / Self Care | Payer: BLUE CROSS/BLUE SHIELD | Attending: Emergency Medicine | Admitting: Emergency Medicine

## 2014-12-26 DIAGNOSIS — R110 Nausea: Secondary | ICD-10-CM | POA: Insufficient documentation

## 2014-12-26 DIAGNOSIS — R109 Unspecified abdominal pain: Secondary | ICD-10-CM | POA: Insufficient documentation

## 2014-12-26 DIAGNOSIS — Z8659 Personal history of other mental and behavioral disorders: Secondary | ICD-10-CM | POA: Insufficient documentation

## 2014-12-26 DIAGNOSIS — Z3202 Encounter for pregnancy test, result negative: Secondary | ICD-10-CM | POA: Insufficient documentation

## 2014-12-26 DIAGNOSIS — Z8619 Personal history of other infectious and parasitic diseases: Secondary | ICD-10-CM | POA: Insufficient documentation

## 2014-12-26 DIAGNOSIS — Z8719 Personal history of other diseases of the digestive system: Secondary | ICD-10-CM | POA: Insufficient documentation

## 2014-12-26 DIAGNOSIS — J4520 Mild intermittent asthma, uncomplicated: Secondary | ICD-10-CM | POA: Insufficient documentation

## 2014-12-26 DIAGNOSIS — R630 Anorexia: Secondary | ICD-10-CM | POA: Insufficient documentation

## 2014-12-26 DIAGNOSIS — M791 Myalgia: Secondary | ICD-10-CM | POA: Insufficient documentation

## 2014-12-26 DIAGNOSIS — R197 Diarrhea, unspecified: Secondary | ICD-10-CM | POA: Insufficient documentation

## 2014-12-26 LAB — COMPREHENSIVE METABOLIC PANEL
ALT: 15 U/L (ref 14–54)
AST: 17 U/L (ref 15–41)
Albumin: 4.2 g/dL (ref 3.5–5.0)
Alkaline Phosphatase: 101 U/L (ref 38–126)
Anion gap: 7 (ref 5–15)
BUN: 13 mg/dL (ref 6–20)
CO2: 25 mmol/L (ref 22–32)
Calcium: 9.5 mg/dL (ref 8.9–10.3)
Chloride: 105 mmol/L (ref 101–111)
Creatinine, Ser: 0.69 mg/dL (ref 0.44–1.00)
GFR calc Af Amer: >60 mL/min (ref >60)
GFR calc non Af Amer: >60 mL/min (ref >60)
Glucose, Bld: 112 mg/dL — ABNORMAL HIGH (ref 65–99)
Potassium: 3.9 mmol/L (ref 3.5–5.1)
Sodium: 137 mmol/L (ref 135–145)
Total Bilirubin: 0.8 mg/dL (ref 0.3–1.2)
Total Protein: 8.1 g/dL (ref 6.5–8.1)

## 2014-12-26 LAB — I-STAT CG4 LACTIC ACID, ED: Lactic Acid, Venous: 1.42 mmol/L (ref 0.5–2.0)

## 2014-12-26 LAB — CBC WITH DIFFERENTIAL/PLATELET
Basophils Absolute: 0 10*3/uL (ref 0.0–0.1)
Basophils Relative: 0 %
Eosinophils Absolute: 0 10*3/uL (ref 0.0–0.7)
Eosinophils Relative: 0 %
HCT: 42.5 % (ref 36.0–46.0)
Hemoglobin: 14.1 g/dL (ref 12.0–15.0)
Lymphocytes Relative: 4 %
Lymphs Abs: 0.4 10*3/uL — ABNORMAL LOW (ref 0.7–4.0)
MCH: 28.7 pg (ref 26.0–34.0)
MCHC: 33.2 g/dL (ref 30.0–36.0)
MCV: 86.4 fL (ref 78.0–100.0)
Monocytes Absolute: 0.3 10*3/uL (ref 0.1–1.0)
Monocytes Relative: 3 %
Neutro Abs: 9.6 10*3/uL — ABNORMAL HIGH (ref 1.7–7.7)
Neutrophils Relative %: 93 %
Platelets: 329 10*3/uL (ref 150–400)
RBC: 4.92 MIL/uL (ref 3.87–5.11)
RDW: 13.8 % (ref 11.5–15.5)
WBC: 10.3 10*3/uL (ref 4.0–10.5)

## 2014-12-26 LAB — URINE MICROSCOPIC-ADD ON: WBC, UA: NONE SEEN WBC/hpf (ref 0–5)

## 2014-12-26 LAB — C DIFFICILE QUICK SCREEN W PCR REFLEX
C Diff antigen: NEGATIVE
C Diff interpretation: NEGATIVE
C Diff toxin: NEGATIVE

## 2014-12-26 LAB — URINALYSIS, ROUTINE W REFLEX MICROSCOPIC
Bilirubin Urine: NEGATIVE
Glucose, UA: NEGATIVE mg/dL
Ketones, ur: NEGATIVE mg/dL
Leukocytes, UA: NEGATIVE
Nitrite: NEGATIVE
Protein, ur: NEGATIVE mg/dL
Specific Gravity, Urine: 1.027 (ref 1.005–1.030)
pH: 5 (ref 5.0–8.0)

## 2014-12-26 LAB — PREGNANCY, URINE: Preg Test, Ur: NEGATIVE

## 2014-12-26 LAB — LIPASE, BLOOD: Lipase: 24 U/L (ref 11–51)

## 2014-12-26 MED ORDER — IOHEXOL 300 MG/ML  SOLN
100.0000 mL | Freq: Once | INTRAMUSCULAR | Status: AC | PRN
  Administered 2014-12-26: 100 mL via INTRAVENOUS

## 2014-12-26 MED ORDER — IOHEXOL 300 MG/ML  SOLN
25.0000 mL | Freq: Once | INTRAMUSCULAR | Status: AC | PRN
  Administered 2014-12-26: 25 mL via ORAL

## 2014-12-26 MED ORDER — SODIUM CHLORIDE 0.9 % IV BOLUS (SEPSIS)
1000.0000 mL | Freq: Once | INTRAVENOUS | Status: AC
Start: 2014-12-26 — End: 2014-12-26
  Administered 2014-12-26: 1000 mL via INTRAVENOUS

## 2014-12-26 NOTE — Discharge Instructions (Signed)
Make sure you drink plenty of fluids to keep yourself well hydrated.  Follow up with your primary care physician for reevaluation.  Wash your hands frequently and get lots of rest.  Diarrhea Diarrhea is frequent loose and watery bowel movements. It can cause you to feel weak and dehydrated. Dehydration can cause you to become tired and thirsty, have a dry mouth, and have decreased urination that often is dark yellow. Diarrhea is a sign of another problem, most often an infection that will not last long. In most cases, diarrhea typically lasts 2-3 days. However, it can last longer if it is a sign of something more serious. It is important to treat your diarrhea as directed by your caregiver to lessen or prevent future episodes of diarrhea. CAUSES  Some common causes include:  Gastrointestinal infections caused by viruses, bacteria, or parasites.  Food poisoning or food allergies.  Certain medicines, such as antibiotics, chemotherapy, and laxatives.  Artificial sweeteners and fructose.  Digestive disorders. HOME CARE INSTRUCTIONS  Ensure adequate fluid intake (hydration): Have 1 cup (8 oz) of fluid for each diarrhea episode. Avoid fluids that contain simple sugars or sports drinks, fruit juices, whole milk products, and sodas. Your urine should be clear or pale yellow if you are drinking enough fluids. Hydrate with an oral rehydration solution that you can purchase at pharmacies, retail stores, and online. You can prepare an oral rehydration solution at home by mixing the following ingredients together:   - tsp table salt.   tsp baking soda.   tsp salt substitute containing potassium chloride.  1  tablespoons sugar.  1 L (34 oz) of water.  Certain foods and beverages may increase the speed at which food moves through the gastrointestinal (GI) tract. These foods and beverages should be avoided and include:  Caffeinated and alcoholic beverages.  High-fiber foods, such as raw fruits and  vegetables, nuts, seeds, and whole grain breads and cereals.  Foods and beverages sweetened with sugar alcohols, such as xylitol, sorbitol, and mannitol.  Some foods may be well tolerated and may help thicken stool including:  Starchy foods, such as rice, toast, pasta, low-sugar cereal, oatmeal, grits, baked potatoes, crackers, and bagels.  Bananas.  Applesauce.  Add probiotic-rich foods to help increase healthy bacteria in the GI tract, such as yogurt and fermented milk products.  Wash your hands well after each diarrhea episode.  Only take over-the-counter or prescription medicines as directed by your caregiver.  Take a warm bath to relieve any burning or pain from frequent diarrhea episodes. SEEK IMMEDIATE MEDICAL CARE IF:   You are unable to keep fluids down.  You have persistent vomiting.  You have blood in your stool, or your stools are black and tarry.  You do not urinate in 6-8 hours, or there is only a small amount of very dark urine.  You have abdominal pain that increases or localizes.  You have weakness, dizziness, confusion, or light-headedness.  You have a severe headache.  Your diarrhea gets worse or does not get better.  You have a fever or persistent symptoms for more than 2-3 days.  You have a fever and your symptoms suddenly get worse. MAKE SURE YOU:   Understand these instructions.  Will watch your condition.  Will get help right away if you are not doing well or get worse.   This information is not intended to replace advice given to you by your health care provider. Make sure you discuss any questions you have with your  health care provider.   Document Released: 12/28/2001 Document Revised: 01/28/2014 Document Reviewed: 09/15/2011 Elsevier Interactive Patient Education [employer].

## 2014-12-26 NOTE — ED Notes (Signed)
Diarrhea, nausea and body aches since this am.

## 2014-12-26 NOTE — ED Provider Notes (Signed)
CSN: [id number]     Arrival date & time 12/26/14  1609 History  By signing my name below, I, Anne Huffman, attest that this documentation has been prepared under the direction and in the presence of Harvel Quale, MD. Electronically Signed: Eustaquio Huffman, ED Scribe. 12/26/2014. 5:17 PM.   Chief Complaint  Patient presents with  . Diarrhea   The history is provided by the patient. No language interpreter was used.     HPI Comments: Anne Huffman is a 49 y.o. female who presents to the Emergency Department complaining of diarrhea, nausea, and myalgias that began at 7:30 AM (approximately 9.5 hours ago). Pt also reports loss of appetite since onset. 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 Denies fever, chills, abdominal pain, vomiting, hematochezia, or any other associated symptoms. No recent antibiotic use or foreign travel.   Past Medical History  Diagnosis Date  . Asthma     since childhood  . History of chicken pox     childhood  . Depression     no treatment  . Ulcer     stomach  . Seasonal allergies   . Fatigue 08/20/2012  . Mild intermittent asthma 03/11/2011  . Preventative health care 09/21/2012  . H. pylori infection 06/09/2013  . Decreased visual acuity 04/21/2014  . Allergy    Past Surgical History  Procedure Laterality Date  . No past surgeries  03/11/2011   Family History  Problem Relation Age of Onset  . Breast cancer Maternal Grandmother   . Cancer Maternal Grandmother     breast  . Hypertension Mother     maternal aunt  . Eczema Mother   . Prostate cancer Neg Hx   . Colon cancer Neg Hx   . Heart disease Neg Hx   . Diabetes Neg Hx   . Depression Neg Hx   . Stroke Neg Hx   . Eczema Sister   . Eczema Sister   . Cancer Other     breast cancer   Social History  Substance Use Topics  . Smoking status: Never Smoker   . Smokeless tobacco: Never Used  . Alcohol Use: No   OB History    No  data available     Review of Systems  Constitutional: Negative for fever and chills.  Gastrointestinal: Positive for nausea and diarrhea. Negative for vomiting, abdominal pain and blood in stool.  Musculoskeletal: Positive for myalgias.  All other systems reviewed and are negative.  Allergies  Peanuts  Home Medications   Prior to Admission medications   Not on File   Triage Vitals: BP 132/86 mmHg  Pulse 96  Temp(Src) 98.7 F (37.1 C) (Oral)  Resp 18  Ht 5\' 3"  (1.6 m)  Wt 176 lb (79.833 kg)  BMI 31.18 kg/m2  SpO2 100%   Physical Exam  Constitutional: She is oriented to person, place, and time. She appears well-developed and well-nourished. No distress.  HENT:  Head: Normocephalic and atraumatic.  Eyes: Conjunctivae and EOM are normal.  Neck: Neck supple. No tracheal deviation present.  Cardiovascular: Normal rate and regular rhythm.   Pulmonary/Chest: Effort normal. No respiratory distress.  Abdominal: Soft. There is tenderness.  Very mild left sided tenderness  Musculoskeletal: Normal range of motion.  Neurological: She is alert and oriented to person, place, and time.  Skin: Skin is warm and dry.  Psychiatric: She has a normal mood and affect. Her behavior is normal.  Nursing note and vitals reviewed.   ED Course  Procedures (including critical care time)  DIAGNOSTIC STUDIES: Oxygen Saturation is 100% on RA, normal by my interpretation.    COORDINATION OF CARE: 5:16 PM-Discussed treatment plan which includes CT A/P with pt at bedside and pt agreed to plan.   Labs Review Labs Reviewed  CBC WITH DIFFERENTIAL/PLATELET - Abnormal; Notable for the following:    Neutro Abs 9.6 (*)    Lymphs Abs 0.4 (*)    All other components within normal limits  COMPREHENSIVE METABOLIC PANEL - Abnormal; Notable for the following:    Glucose, Bld 112 (*)    All other components within normal limits  URINALYSIS, ROUTINE W REFLEX MICROSCOPIC (NOT AT [facility]) - Abnormal; Notable  for the following:    Hgb urine dipstick SMALL (*)    All other components within normal limits  URINE MICROSCOPIC-ADD ON - Abnormal; Notable for the following:    Squamous Epithelial / LPF 0-5 (*)    Bacteria, UA RARE (*)    All other components within normal limits  STOOL CULTURE  C DIFFICILE QUICK SCREEN W PCR REFLEX  LIPASE, BLOOD  PREGNANCY, URINE  I-STAT CG4 LACTIC ACID, ED    Imaging Review Ct Abdomen Pelvis W Contrast  12/26/2014  CLINICAL DATA:  nausea, diarrhea, left sided abdominal pains, all since early today, hx of asthma, pylori infection, no other complaints EXAM: CT ABDOMEN AND PELVIS WITH CONTRAST TECHNIQUE: Multidetector CT imaging of the abdomen and pelvis was performed using the standard protocol following bolus administration of intravenous contrast. CONTRAST:  59mL OMNIPAQUE IOHEXOL 300 MG/ML SOLN, 181mL OMNIPAQUE IOHEXOL 300 MG/ML SOLN COMPARISON:  09/21/2002 by report only FINDINGS: Visualized lung bases clear. Unremarkable liver, nondistended gallbladder, spleen, adrenal glands, kidneys, pancreas. No hydronephrosis. Stomach, small bowel, and colon are nondilated.  Normal appendix. Portal vein and aorta patent. Urinary bladder incompletely distended. Bilateral pelvic phleboliths. Uterus and adnexal regions grossly unremarkable. No ascites. No free air. No adenopathy. Lumbar spine grossly intact. IMPRESSION: 1. Negative. Electronically Signed   By: Lucrezia Europe M.D.   On: 12/26/2014 18:59   I have personally reviewed and evaluated these images and lab results as part of my medical decision-making.   EKG Interpretation None      MDM  Patient seen and evaluated in stable condition.  Labs and imaging unremarkable.  Patient well appearing.  Patient not able to give stool sample in the ER.  Patient discharged home in stable condition with instruction to keep herself well hydrated, follow up outpatient, and educated on hand hygiene.  Patient expressed understanding of  results and agreement with plan of care. Final diagnoses:  None   1. Acute diarrhea  I personally performed the services described in this documentation, which was scribed in my presence. The recorded information has been reviewed and is accurate.      Harvel Quale, MD 12/27/14 2059

## 2014-12-26 NOTE — ED Notes (Signed)
Unable to provide stool specimen at this time.

## 2014-12-29 ENCOUNTER — Ambulatory Visit (INDEPENDENT_AMBULATORY_CARE_PROVIDER_SITE_OTHER): Payer: Self-pay | Admitting: Family Medicine

## 2014-12-29 ENCOUNTER — Encounter: Payer: Self-pay | Admitting: Family Medicine

## 2014-12-29 VITALS — BP 134/84 | HR 73 | Temp 98.3°F | Ht 63.0 in | Wt 174.4 lb

## 2014-12-29 DIAGNOSIS — K219 Gastro-esophageal reflux disease without esophagitis: Secondary | ICD-10-CM

## 2014-12-29 DIAGNOSIS — K529 Noninfective gastroenteritis and colitis, unspecified: Secondary | ICD-10-CM

## 2014-12-29 NOTE — Progress Notes (Signed)
Anne Huffman [id number] 1965-05-11 12/29/2014      Patient Progress Note   Subjective  Chief Complaint  Chief Complaint  Patient presents with  . Follow-up    ER, Body aches and nausea    HPI  49 year old female presents for ER follow up after being seen 4 days ago for diarrhea, nausea, body aches. She started feeling better yesterday and feels back to normal today. She endorses nausea, diarrhea on Tuesday. Denies vomiting and fever. 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 She has not recently taken any antibiotics or eaten any food that she is suspicious of. She has no other concerns. Stool sample negative for c diff.   Patient denies shortness of breath, chest pain,changes in urination, GI issues, recent fevers or illnesses    Past Medical History  Diagnosis Date  . Asthma     since childhood  . History of chicken pox     childhood  . Depression     no treatment  . Ulcer     stomach  . Seasonal allergies   . Fatigue 08/20/2012  . Mild intermittent asthma 03/11/2011  . Preventative health care 09/21/2012  . H. pylori infection 06/09/2013  . Decreased visual acuity 04/21/2014  . Allergy     Past Surgical History  Procedure Laterality Date  . No past surgeries  03/11/2011    Family History  Problem Relation Age of Onset  . Breast cancer Maternal Grandmother   . Cancer Maternal Grandmother     breast  . Hypertension Mother     maternal aunt  . Eczema Mother   . Prostate cancer Neg Hx   . Colon cancer Neg Hx   . Heart disease Neg Hx   . Diabetes Neg Hx   . Depression Neg Hx   . Stroke Neg Hx   . Eczema Sister   . Eczema Sister   . Cancer Other     breast cancer    Social History   Social History  . Marital Status: Single    Spouse Name: N/A  . Number of Children: N/A  . Years of Education: N/A   Occupational History  . Not on file.   Social History Main Topics  . Smoking status: Never Smoker   . Smokeless tobacco:  Never Used  . Alcohol Use: No  . Drug Use: No  . 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 Not on file   Social History Narrative    No current outpatient prescriptions on file prior to visit.   No current facility-administered medications on file prior to visit.    Allergies  Allergen Reactions  . Peanuts [Peanut Oil] Itching    Review of Systems   Constitutional: Negative for fever and malaise/fatigue.  HENT: Negative for congestion.  Eyes: Negative for discharge.  Respiratory: Negative for shortness of breath.  Cardiovascular: Negative for chest pain, palpitations and leg swelling.  Gastrointestinal: Negative for nausea, abdominal pain and diarrhea.  Genitourinary: Negative for dysuria and urgency, hematuria and flank pain.  Musculoskeletal: Negative for myalgias and falls.  Skin: Negative for rash.  Neurological: Negative for loss of consciousness and headaches.  Endo/Heme/Allergies: Negative for polydipsia.  Psychiatric/Behavioral: Negative for depression and suicidal ideas. The patient is not nervous/anxious and does not have insomnia.   Objective  BP 134/84  mmHg  Pulse 73  Temp(Src) 98.3 F (36.8 C) (Oral)  Ht 5\' 3"  (1.6 m)  Wt 174 lb 6 oz (79.096 kg)  BMI 30.90 kg/m2  SpO2 100%  Physical Exam   Constitutional: Oriented to person, place, and time. Appears well-nourished. No distress.  Eyes: EOM are normal. Pupils are equal, round, and reactive to light.  Cardiovascular: Normal rate and regular rhythm.  Pulmonary/Chest: Breath sounds normal.  Abdominal: Soft. Bowel sounds are normal.  Lymphadenopathy:   No cervical adenopathy.  Neurological: Alert and oriented to person, place, and time. Normal reflexes. No cranial nerve deficit.    Assessment & Plan  Nausea, Diarrhea, Myalgias -likely due to viral illness -resolved, return if  experience any symptoms

## 2014-12-29 NOTE — Patient Instructions (Addendum)
[place], 10 strain 1 cap daily probiotic, order [email or web address]  BRAT, bananas, rice applesauce and toast  Food Choices for Gastroesophageal Reflux Disease, Adult When you have gastroesophageal reflux disease (GERD), the foods you eat and your eating habits are very important. Choosing the right foods can help ease your discomfort.  WHAT GUIDELINES DO I NEED TO FOLLOW?   Choose fruits, vegetables, whole grains, and low-fat dairy products.   Choose low-fat meat, fish, and poultry.  Limit fats such as oils, salad dressings, butter, nuts, and avocado.   Keep a food diary. This helps you identify foods that cause symptoms.   Avoid foods that cause symptoms. These may be different for everyone.   Eat small meals often instead of 3 large meals a day.   Eat your meals slowly, in a place where you are relaxed.   Limit fried foods.   Cook foods using methods other than frying.   Avoid drinking alcohol.   Avoid drinking large amounts of liquids with your meals.   Avoid bending over or lying down until 2-3 hours after eating.  WHAT FOODS ARE NOT RECOMMENDED?  These are some foods and drinks that may make your symptoms worse: Vegetables Tomatoes. Tomato juice. Tomato and spaghetti sauce. Chili peppers. Onion and garlic. Horseradish. Fruits Oranges, grapefruit, and lemon (fruit and juice). Meats High-fat meats, fish, and poultry. This includes hot dogs, ribs, ham, sausage, salami, and bacon. Dairy Whole milk and chocolate milk. Sour cream. Cream. Butter. Ice cream. Cream cheese.  Drinks Coffee and tea. Bubbly (carbonated) drinks or energy drinks. Condiments Hot sauce. Barbecue sauce.  Sweets/Desserts Chocolate and cocoa. Donuts. Peppermint and spearmint. Fats and Oils High-fat foods. This includes [place] fries and potato chips. Other Vinegar. Strong spices. This includes black pepper, white pepper, red pepper, cayenne, curry powder, cloves, ginger, and chili  powder. The items listed above may not be a complete list of foods and drinks to avoid. Contact your dietitian for more information.   This information is not intended to replace advice given to you by your health care provider. Make sure you discuss any questions you have with your health care provider.   Document Released: 07/09/2011 Document Revised: 01/28/2014 Document Reviewed: 11/11/2012 Elsevier Interactive Patient Education [employer].

## 2014-12-29 NOTE — Progress Notes (Signed)
Pre visit review using our clinic review tool, if applicable. No additional management support is needed unless otherwise documented below in the visit note. 

## 2014-12-30 LAB — STOOL CULTURE

## 2015-01-01 ENCOUNTER — Encounter: Payer: Self-pay | Admitting: Family Medicine

## 2015-01-01 DIAGNOSIS — K529 Noninfective gastroenteritis and colitis, unspecified: Secondary | ICD-10-CM

## 2015-01-01 HISTORY — DX: Noninfective gastroenteritis and colitis, unspecified: K52.9

## 2015-01-01 NOTE — Progress Notes (Signed)
Subjective:    Patient ID: Anne Huffman, female    DOB: 22-Oct-1965, 49 y.o.   MRN: [id number]  Chief Complaint  Patient presents with  . Follow-up    ER, Body aches and nausea    HPI Patient is in today for hospital follow-up. She presented to the ER roughly 4 days ago with nausea, vomiting and diarrhea. Symptoms came on suddenly and worse or should with body aches and malaise. Symptoms have fully resolved and she feels well today. She denies any fevers, chills or acute concerns. Denies CP/palp/SOB/HA/congestion/fevers/GI or GU c/o. Taking meds as prescribed  Past Medical History  Diagnosis Date  . Asthma     since childhood  . History of chicken pox     childhood  . Depression     no treatment  . Ulcer     stomach  . Seasonal allergies   . Fatigue 08/20/2012  . Mild intermittent asthma 03/11/2011  . Preventative health care 09/21/2012  . H. pylori infection 06/09/2013  . Decreased visual acuity 04/21/2014  . Allergy   . Gastroenteritis 01/01/2015    Past Surgical History  Procedure Laterality Date  . No past surgeries  03/11/2011    Family History  Problem Relation Age of Onset  . Breast cancer Maternal Grandmother   . Cancer Maternal Grandmother     breast  . Hypertension Mother     maternal aunt  . Eczema Mother   . Prostate cancer Neg Hx   . Colon cancer Neg Hx   . Heart disease Neg Hx   . Diabetes Neg Hx   . Depression Neg Hx   . Stroke Neg Hx   . Eczema Sister   . Eczema Sister   . Cancer Other     breast cancer    Social History   Social History  . Marital Status: Single    Spouse Name: N/A  . Number of Children: N/A  . Years of Education: N/A   Occupational History  . Not on file.   Social History Main Topics  . Smoking status: Never Smoker   . Smokeless tobacco: Never Used  . Alcohol Use: No  . Drug Use: No  . 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 Not on file   Social History Narrative    No outpatient prescriptions prior to visit.   No facility-administered medications prior to visit.    Allergies  Allergen Reactions  . Peanuts [Peanut Oil] Itching    Review of Systems  Constitutional: Negative for fever and malaise/fatigue.  HENT: Negative for congestion.   Eyes: Negative for discharge.  Respiratory: Negative for shortness of breath.   Cardiovascular: Negative for chest pain, palpitations and leg swelling.  Gastrointestinal: Positive for nausea, vomiting and diarrhea. Negative for abdominal pain.  Genitourinary: Negative for dysuria.  Musculoskeletal: Negative for falls.  Skin: Negative for rash.  Neurological: Negative for loss of consciousness and headaches.  Endo/Heme/Allergies: Negative for environmental allergies.  Psychiatric/Behavioral: Negative for depression. The patient is not nervous/anxious.        Objective:    Physical Exam  Constitutional: She is oriented to person, place, and time. She appears well-developed and well-nourished. No distress.  HENT:  Head: Normocephalic and atraumatic.  Nose: Nose normal.  Eyes: Right eye exhibits no discharge. Left eye exhibits no discharge.  Neck: Normal range  of motion. Neck supple.  Cardiovascular: Normal rate and regular rhythm.  Exam reveals no friction rub.   No murmur heard. Pulmonary/Chest: Effort normal and breath sounds normal.  Abdominal: Soft. Bowel sounds are normal. There is no tenderness.  Musculoskeletal: She exhibits no edema.  Neurological: She is alert and oriented to person, place, and time.  Skin: Skin is warm and dry.  Psychiatric: She has a normal mood and affect.  Nursing note and vitals reviewed.   BP 134/84 mmHg  Pulse 73  Temp(Src) 98.3 F (36.8 C) (Oral)  Ht 5\' 3"  (1.6 m)  Wt 174 lb 6 oz (79.096 kg)  BMI 30.90 kg/m2  SpO2 100% Wt Readings from Last 3 Encounters:  12/29/14 174 lb 6 oz (79.096  kg)  12/26/14 176 lb (79.833 kg)  08/05/14 176 lb (79.833 kg)     Lab Results  Component Value Date   WBC 10.3 12/26/2014   HGB 14.1 12/26/2014   HCT 42.5 12/26/2014   PLT 329 12/26/2014   GLUCOSE 112* 12/26/2014   CHOL 187 04/08/2014   TRIG 31.0 04/08/2014   HDL 82.80 04/08/2014   LDLCALC 98 04/08/2014   ALT 15 12/26/2014   AST 17 12/26/2014   NA 137 12/26/2014   K 3.9 12/26/2014   CL 105 12/26/2014   CREATININE 0.69 12/26/2014   BUN 13 12/26/2014   CO2 25 12/26/2014   TSH 1.11 04/08/2014   HGBA1C 5.7 04/08/2014    Lab Results  Component Value Date   TSH 1.11 04/08/2014   Lab Results  Component Value Date   WBC 10.3 12/26/2014   HGB 14.1 12/26/2014   HCT 42.5 12/26/2014   MCV 86.4 12/26/2014   PLT 329 12/26/2014   Lab Results  Component Value Date   NA 137 12/26/2014   K 3.9 12/26/2014   CO2 25 12/26/2014   GLUCOSE 112* 12/26/2014   BUN 13 12/26/2014   CREATININE 0.69 12/26/2014   BILITOT 0.8 12/26/2014   ALKPHOS 101 12/26/2014   AST 17 12/26/2014   ALT 15 12/26/2014   PROT 8.1 12/26/2014   ALBUMIN 4.2 12/26/2014   CALCIUM 9.5 12/26/2014   ANIONGAP 7 12/26/2014   GFR 115.06 04/08/2014   Lab Results  Component Value Date   CHOL 187 04/08/2014   Lab Results  Component Value Date   HDL 82.80 04/08/2014   Lab Results  Component Value Date   LDLCALC 98 04/08/2014   Lab Results  Component Value Date   TRIG 31.0 04/08/2014   Lab Results  Component Value Date   CHOLHDL 2 04/08/2014   Lab Results  Component Value Date   HGBA1C 5.7 04/08/2014       Assessment & Plan:   Problem List Items Addressed This Visit    Gastroenteritis - Primary    Was seen in ED earlier in the week with nausea, vomiting and diarrhea. She feels better today and offers no complaints. Likely had an infectious gastroenteritis which has resolved      GERD (gastroesophageal reflux disease)    Avoid offending foods, start probiotics. Do not eat large meals in late  evening and consider raising head of bed.          Ms. Batta does not currently have medications on file.  No orders of the defined types were placed in this encounter.     Penni Homans, MD

## 2015-01-01 NOTE — Assessment & Plan Note (Signed)
Was seen in ED earlier in the week with nausea, vomiting and diarrhea. She feels better today and offers no complaints. Likely had an infectious gastroenteritis which has resolved

## 2015-01-01 NOTE — Assessment & Plan Note (Signed)
Avoid offending foods, start probiotics. Do not eat large meals in late evening and consider raising head of bed.  

## 2015-03-09 ENCOUNTER — Encounter: Payer: Self-pay | Admitting: Medical

## 2015-03-09 ENCOUNTER — Ambulatory Visit (INDEPENDENT_AMBULATORY_CARE_PROVIDER_SITE_OTHER): Payer: Self-pay | Admitting: Medical

## 2015-03-09 VITALS — BP 126/80 | HR 78 | Temp 98.0°F | Ht 63.0 in | Wt 173.6 lb

## 2015-03-09 DIAGNOSIS — R062 Wheezing: Secondary | ICD-10-CM

## 2015-03-09 DIAGNOSIS — J309 Allergic rhinitis, unspecified: Secondary | ICD-10-CM

## 2015-03-09 MED ORDER — BENZONATATE 100 MG PO CAPS: 100.0000 mg | ORAL_CAPSULE | Freq: Three times a day (TID) | ORAL | Status: DC | PRN

## 2015-03-09 MED ORDER — AZITHROMYCIN 250 MG PO TABS: ORAL_TABLET | ORAL | Status: DC

## 2015-03-09 MED ORDER — FLUTICASONE PROPIONATE 50 MCG/ACT NA SUSP: 2.0000 | Freq: Every day | NASAL | Status: DC

## 2015-03-09 MED ORDER — ALBUTEROL SULFATE HFA 108 (90 BASE) MCG/ACT IN AERS: 2.0000 | INHALATION_SPRAY | Freq: Four times a day (QID) | RESPIRATORY_TRACT | Status: DC | PRN

## 2015-03-09 NOTE — Progress Notes (Signed)
Subjective:    Patient ID: Anne Huffman, female    DOB: 07-09-1965, 50 y.o.   MRN: [id number]  HPI   Pt in for evaluation. She states on Saturday she got cough that was productive. She feels mild congestion in chest. Some mild wheezing. Hx of asthma in past but years since any inhaler use. Pt eyes have been itching a little bit.  Some sneezing as well.    Review of Systems  Constitutional: Negative for fever, chills and fatigue.  HENT: Positive for congestion, postnasal drip, rhinorrhea and sneezing.   Respiratory: Positive for cough and wheezing.   Cardiovascular: Negative for chest pain and palpitations.  Gastrointestinal: Negative for abdominal pain.  Musculoskeletal: Negative for back pain.  Neurological: Negative for dizziness and light-headedness.  Hematological: Negative for adenopathy. Does not bruise/bleed easily.  Psychiatric/Behavioral: Negative for behavioral problems and confusion.    Past Medical History  Diagnosis Date  . Asthma     since childhood  . History of chicken pox     childhood  . Depression     no treatment  . Ulcer     stomach  . Seasonal allergies   . Fatigue 08/20/2012  . Mild intermittent asthma 03/11/2011  . Preventative health care 09/21/2012  . H. pylori infection 06/09/2013  . Decreased visual acuity 04/21/2014  . Allergy   . Gastroenteritis 01/01/2015    Social History   Social History  . Marital Status: Single    Spouse Name: N/A  . Number of Children: N/A  . Years of Education: N/A   Occupational History  . Not on file.   Social History Main Topics  . Smoking status: Never Smoker   . Smokeless tobacco: Never Used  . Alcohol Use: No  . Drug Use: No  . 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 Not on file   Social History Narrative    Past Surgical History  Procedure Laterality Date  . No past surgeries   03/11/2011    Family History  Problem Relation Age of Onset  . Breast cancer Maternal Grandmother   . Cancer Maternal Grandmother     breast  . Hypertension Mother     maternal aunt  . Eczema Mother   . Prostate cancer Neg Hx   . Colon cancer Neg Hx   . Heart disease Neg Hx   . Diabetes Neg Hx   . Depression Neg Hx   . Stroke Neg Hx   . Eczema Sister   . Eczema Sister   . Cancer Other     breast cancer    Allergies  Allergen Reactions  . Peanuts [Peanut Oil] Itching    No current outpatient prescriptions on file prior to visit.   No current facility-administered medications on file prior to visit.    BP 126/80 mmHg  Pulse 78  Temp(Src) 98 F (36.7 C) (Oral)  Ht 5\' 3"  (1.6 m)  Wt 173 lb 9.6 oz (78.744 kg)  BMI 30.76 kg/m2  SpO2 98%       Objective:   Physical Exam   General  Mental Status - Alert. General Appearance - Well groomed. Not in acute distress.  Skin Rashes- No Rashes.  HEENT Head- Normal. Ear Auditory Canal - Left- Normal. Right - Normal.Tympanic Membrane- Left- Normal. Right- Normal. Eye Sclera/Conjunctiva- Left- Normal. Right- Normal. Nose & Sinuses  Nasal Mucosa- Left-  Boggy and Congested. Right-  Boggy and  Congested.Bilateral  No maxillary and no  frontal sinus pressure. Mouth & Throat Lips: Upper Lip- Normal: no dryness, cracking, pallor, cyanosis, or vesicular eruption. Lower Lip-Normal: no dryness, cracking, pallor, cyanosis or vesicular eruption. Buccal Mucosa- Bilateral- No Aphthous ulcers. Oropharynx- No Discharge or Erythema. +pnd Tonsils: Characteristics- Bilateral- No Erythema or Congestion. Size/Enlargement- Bilateral- No enlargement. Discharge- bilateral-None.  Neck Neck- Supple. No Masses.   Chest and Lung Exam Auscultation: Breath Sounds:- even and unlabored. Faint expiratory wheezing  Cardiovascular Auscultation:Rythm- Regular, rate and rhythm. Murmurs & Other Heart Sounds:Ausculatation of the heart reveal- No  Murmurs.  Lymphatic Head & Neck General Head & Neck Lymphatics: Bilateral: Description- No Localized lymphadenopathy.      Assessment & Plan:  Allergic rhinitis likely with mild wheezing/asthma flare.  Rx flonase for nasal congestion.  For cough benzonatate.  For wheezing rx albuterol inhaler.  If you get worsening infection  Type symptoms as discussed then start azithromycin.  Follow up in 7 days or as needed

## 2015-03-09 NOTE — Progress Notes (Signed)
Pre visit review using our clinic review tool, if applicable. No additional management support is needed unless otherwise documented below in the visit note. 

## 2015-03-09 NOTE — Patient Instructions (Signed)
Allergic rhinitis likely with mild wheezing/asthma flare.  Rx flonase for nasal congestion.  For cough benzonatate.  For wheezing rx albuterol inhaler.  If you get worsening infection  Type symptoms as discussed then start azithromycin.  Follow up in 7 days or as needed

## 2015-03-29 ENCOUNTER — Ambulatory Visit (INDEPENDENT_AMBULATORY_CARE_PROVIDER_SITE_OTHER): Payer: Self-pay | Admitting: Medical

## 2015-03-29 ENCOUNTER — Encounter: Payer: Self-pay | Admitting: Medical

## 2015-03-29 VITALS — BP 122/80 | HR 78 | Temp 99.8°F | Ht 63.0 in | Wt 168.4 lb

## 2015-03-29 DIAGNOSIS — J0100 Acute maxillary sinusitis, unspecified: Secondary | ICD-10-CM

## 2015-03-29 DIAGNOSIS — J111 Influenza due to unidentified influenza virus with other respiratory manifestations: Secondary | ICD-10-CM

## 2015-03-29 DIAGNOSIS — R52 Pain, unspecified: Secondary | ICD-10-CM

## 2015-03-29 LAB — POCT INFLUENZA A/B
Influenza A, POC: NEGATIVE
Influenza B, POC: NEGATIVE

## 2015-03-29 MED ORDER — OSELTAMIVIR PHOSPHATE 75 MG PO CAPS: 75.0000 mg | ORAL_CAPSULE | Freq: Two times a day (BID) | ORAL | Status: DC

## 2015-03-29 MED ORDER — FLUTICASONE PROPIONATE 50 MCG/ACT NA SUSP: 2.0000 | Freq: Every day | NASAL | Status: DC

## 2015-03-29 MED ORDER — BENZONATATE 100 MG PO CAPS: 100.0000 mg | ORAL_CAPSULE | Freq: Three times a day (TID) | ORAL | Status: DC | PRN

## 2015-03-29 MED ORDER — AZITHROMYCIN 250 MG PO TABS: ORAL_TABLET | ORAL | Status: DC

## 2015-03-29 NOTE — Patient Instructions (Addendum)
Likely flu followed by secondary bacterial infection/sinusitis.  For flu will rx tamiflu. For secondary sinus infection azithromycin. For cough benzonatate. For nasal congestion flonase.  Follow up in 1 wk or as needed.  Flu test was negative.(although rapid test can give false negative)

## 2015-03-29 NOTE — Progress Notes (Signed)
Subjective:    Patient ID: Anne Huffman, female    DOB: 02-01-1965, 50 y.o.   MRN: [id number]  HPI  Pt states on Monday evening had diffuse severe body aches, with fever and chills. Sunday felt fine.  Pt has some cough and runny nose. Some sinus pain as well. Pt when she coughs is bringing up mucous.  Pt mom sick recently and given tamiflu.    Review of Systems  Constitutional: Positive for fever, chills and fatigue.  HENT: Positive for congestion, rhinorrhea and sinus pressure. Negative for ear pain and sore throat.   Respiratory: Positive for cough. Negative for shortness of breath and wheezing.   Cardiovascular: Negative for chest pain and palpitations.  Gastrointestinal: Negative for abdominal pain.  Musculoskeletal: Positive for myalgias. Negative for back pain.  Skin: Negative for rash.  Neurological: Negative for dizziness, weakness and headaches.  Hematological: Negative for adenopathy. Does not bruise/bleed easily.  Psychiatric/Behavioral: Negative for behavioral problems and confusion.     Past Medical History  Diagnosis Date  . Asthma     since childhood  . History of chicken pox     childhood  . Depression     no treatment  . Ulcer     stomach  . Seasonal allergies   . Fatigue 08/20/2012  . Mild intermittent asthma 03/11/2011  . Preventative health care 09/21/2012  . H. pylori infection 06/09/2013  . Decreased visual acuity 04/21/2014  . Allergy   . Gastroenteritis 01/01/2015    Social History   Social History  . Marital Status: Single    Spouse Name: N/A  . Number of Children: N/A  . Years of Education: N/A   Occupational History  . Not on file.   Social History Main Topics  . Smoking status: Never Smoker   . Smokeless tobacco: Never Used  . Alcohol Use: No  . Drug Use: No  . 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 Not on file    Social History Narrative    Past Surgical History  Procedure Laterality Date  . No past surgeries  03/11/2011    Family History  Problem Relation Age of Onset  . Breast cancer Maternal Grandmother   . Cancer Maternal Grandmother     breast  . Hypertension Mother     maternal aunt  . Eczema Mother   . Prostate cancer Neg Hx   . Colon cancer Neg Hx   . Heart disease Neg Hx   . Diabetes Neg Hx   . Depression Neg Hx   . Stroke Neg Hx   . Eczema Sister   . Eczema Sister   . Cancer Other     breast cancer    Allergies  Allergen Reactions  . Peanuts [Peanut Oil] Itching    Current Outpatient Prescriptions on File Prior to Visit  Medication Sig Dispense Refill  . albuterol (PROVENTIL HFA;VENTOLIN HFA) 108 (90 Base) MCG/ACT inhaler Inhale 2 puffs into the lungs every 6 (six) hours as needed for wheezing or shortness of breath. 1 Inhaler 0  . fluticasone (FLONASE) 50 MCG/ACT nasal spray Place 2 sprays into both nostrils daily. 16 g 1   No current facility-administered medications on file prior to visit.    BP 122/80 mmHg  Pulse 78  Temp(Src) 99.8 F (37.7 C) (Oral)  Ht 5\' 3"  (1.6 m)  Wt  168 lb 6.4 oz (76.386 kg)  BMI 29.84 kg/m2  SpO2 98%       Objective:   Physical Exam  General  Mental Status - Alert. General Appearance - Well groomed. Not in acute distress.  Skin Rashes- No Rashes.  HEENT Head- Normal. Ear Auditory Canal - Left- Normal. Right - Normal.Tympanic Membrane- Left- Normal. Right- Normal. Eye Sclera/Conjunctiva- Left- Normal. Right- Normal. Nose & Sinuses Nasal Mucosa- Left-  Boggy and Congested. Right-  Boggy and  Congested.Bilateral maxillary and frontal sinus pressure. Mouth & Throat Lips: Upper Lip- Normal: no dryness, cracking, pallor, cyanosis, or vesicular eruption. Lower Lip-Normal: no dryness, cracking, pallor, cyanosis or vesicular eruption. Buccal Mucosa- Bilateral- No Aphthous ulcers. Oropharynx- No Discharge or  Erythema. Tonsils: Characteristics- Bilateral- No Erythema or Congestion. Size/Enlargement- Bilateral- No enlargement. Discharge- bilateral-None.  Neck Neck- Supple. No Masses.   Chest and Lung Exam Auscultation: Breath Sounds:-Clear even and unlabored.  Cardiovascular Auscultation:Rythm- Regular, rate and rhythm. Murmurs & Other Heart Sounds:Ausculatation of the heart reveal- No Murmurs.  Lymphatic Head & Neck General Head & Neck Lymphatics: Bilateral: Description- No Localized lymphadenopathy.       Assessment & Plan:  Likely flu followed by secondary bacterial infection/sinusitis.  For flu will rx tamiflu. For secondary sinus infection azithromycin. For cough benzonatate. For nasal congestion flonase.  Follow up in 1 wk or as needed.

## 2015-03-29 NOTE — Progress Notes (Signed)
Pre visit review using our clinic review tool, if applicable. No additional management support is needed unless otherwise documented below in the visit note. 

## 2015-06-09 ENCOUNTER — Encounter: Payer: Self-pay | Admitting: Family Medicine

## 2015-06-09 ENCOUNTER — Telehealth: Payer: Self-pay | Admitting: Family Medicine

## 2015-06-13 NOTE — Telephone Encounter (Signed)
Pt was no show for cpe 06/09/15, pt has not rescheduled, 1st no show w/in a year, charge or no charge?

## 2015-06-13 NOTE — Telephone Encounter (Signed)
No charge. 

## 2015-10-16 ENCOUNTER — Emergency Department (HOSPITAL_COMMUNITY): Payer: Self-pay

## 2015-10-16 ENCOUNTER — Emergency Department (HOSPITAL_COMMUNITY)
Admission: EM | Admit: 2015-10-16 | Discharge: 2015-10-17 | Disposition: A | Discharge status: Home / Self Care | Payer: Self-pay | Attending: Emergency Medicine | Admitting: Emergency Medicine

## 2015-10-16 ENCOUNTER — Encounter (HOSPITAL_COMMUNITY): Payer: Self-pay

## 2015-10-16 DIAGNOSIS — J45909 Unspecified asthma, uncomplicated: Secondary | ICD-10-CM | POA: Insufficient documentation

## 2015-10-16 DIAGNOSIS — Z91010 Allergy to peanuts: Secondary | ICD-10-CM | POA: Insufficient documentation

## 2015-10-16 DIAGNOSIS — R0789 Other chest pain: Secondary | ICD-10-CM | POA: Insufficient documentation

## 2015-10-16 DIAGNOSIS — R079 Chest pain, unspecified: Secondary | ICD-10-CM

## 2015-10-16 LAB — CBC
HCT: 42.4 % (ref 36.0–46.0)
Hemoglobin: 13.9 g/dL (ref 12.0–15.0)
MCH: 29.3 pg (ref 26.0–34.0)
MCHC: 32.8 g/dL (ref 30.0–36.0)
MCV: 89.5 fL (ref 78.0–100.0)
Platelets: 331 10*3/uL (ref 150–400)
RBC: 4.74 MIL/uL (ref 3.87–5.11)
RDW: 13 % (ref 11.5–15.5)
WBC: 9 10*3/uL (ref 4.0–10.5)

## 2015-10-16 LAB — BASIC METABOLIC PANEL
Anion gap: 9 (ref 5–15)
BUN: 15 mg/dL (ref 6–20)
CO2: 27 mmol/L (ref 22–32)
Calcium: 9.5 mg/dL (ref 8.9–10.3)
Chloride: 106 mmol/L (ref 101–111)
Creatinine, Ser: 0.8 mg/dL (ref 0.44–1.00)
GFR calc Af Amer: >60 mL/min (ref >60)
GFR calc non Af Amer: >60 mL/min (ref >60)
Glucose, Bld: 93 mg/dL (ref 65–99)
Potassium: 4.5 mmol/L (ref 3.5–5.1)
Sodium: 142 mmol/L (ref 135–145)

## 2015-10-16 LAB — I-STAT TROPONIN, ED: Troponin i, poc: 0 ng/mL (ref 0.00–0.08)

## 2015-10-16 NOTE — ED Triage Notes (Signed)
Pt reports chest pain that is worse with deep breathing. Pt denies cardiac hx. No resp distress noted, resp e/u.

## 2015-10-17 NOTE — Discharge Instructions (Signed)

## 2015-10-17 NOTE — ED Provider Notes (Signed)
[facility] DEPT Provider Note   CSN: [id number] Arrival date & time: 10/16/15  1602  By signing my name below, I, Maud Deed. Royston Sinner, attest that this documentation has been prepared under the direction and in the presence of Ripley Fraise, MD.  Electronically Signed: Maud Deed. Royston Sinner, ED Scribe. 10/17/15. 12:12 AM.    History   Chief Complaint Chief Complaint  Patient presents with  . Chest Pain  . Shortness of Breath   The history is provided by the patient. No language interpreter was used.  Chest Pain   This is a new problem. The problem occurs daily. The problem has been gradually worsening. The pain is associated with coughing. The pain is moderate. The pain does not radiate. The symptoms are aggravated by deep breathing. Associated symptoms include cough, headaches and shortness of breath. Pertinent negatives include no fever, no nausea and no vomiting. Treatments tried: Ibuprofen. The treatment provided significant relief.    HPI Comments: Anne Huffman is a 50 y.o. female without any pertinent past medical history who presents to the Emergency Department complaining of intermittent, worsening chest pain x 3 days. Pain is described as tightness. Discomfort is made worse at nighttime and with deep breathing. She also reports intermittent shortness of breath, HA, and productive cough consisting of yellow mucous. Pt states she recently had cold like symptoms but unsure if related to chest pain. OTC Ibuprofen attempted at home for HA with improved. No recent fever, chills, nausea, vomiting, or abdominal pain. No recent long distance travel. Denies any prior history of blood clots. She is not currently on any birth control.   PCP: Penni Homans, MD    Past Medical History:  Diagnosis Date  . Allergy   . Asthma    since childhood  . Decreased visual acuity 04/21/2014  . Depression    no treatment  . Fatigue 08/20/2012  . Gastroenteritis 01/01/2015  . H. pylori infection  06/09/2013  . History of chicken pox    childhood  . Mild intermittent asthma 03/11/2011  . Preventative health care 09/21/2012  . Seasonal allergies   . Ulcer    stomach    Patient Active Problem List   Diagnosis Date Noted  . Gastroenteritis 01/01/2015  . Decreased visual acuity 04/21/2014  . H. pylori infection 06/09/2013  . GERD (gastroesophageal reflux disease) 06/09/2013  . Preventative health care 09/21/2012  . Cervical cancer screening 09/18/2012  . Fatigue 08/20/2012  . Dermatitis 07/18/2011  . Lung nodule 05/16/2011  . Neck pain on right side 04/06/2011  . Radicular pain in right arm 04/06/2011  . Breast cancer screening 03/11/2011  . Anemia 03/11/2011  . Abnormal liver enzymes 03/11/2011  . Mild intermittent asthma 03/11/2011    Past Surgical History:  Procedure Laterality Date  . NO PAST SURGERIES  03/11/2011    OB History    No data available       Home Medications    Prior to Admission medications   Medication Sig Start Date End Date Taking? Authorizing Provider  albuterol (PROVENTIL HFA;VENTOLIN HFA) 108 (90 Base) MCG/ACT inhaler Inhale 2 puffs into the lungs every 6 (six) hours as needed for wheezing or shortness of breath. 03/09/15   Percell Miller Saguier, PA-C  azithromycin (ZITHROMAX) 250 MG tablet Take 2 tablets by mouth on day 1, followed by 1 tablet by mouth daily for 4 days. 03/29/15   Percell Miller Saguier, PA-C  benzonatate (TESSALON) 100 MG capsule Take 1 capsule (100 mg total) by mouth 3 (three) times  daily as needed. 03/29/15   Percell Miller Saguier, PA-C  fluticasone (FLONASE) 50 MCG/ACT nasal spray Place 2 sprays into both nostrils daily. 03/09/15   Percell Miller Saguier, PA-C  fluticasone (FLONASE) 50 MCG/ACT nasal spray Place 2 sprays into both nostrils daily. 03/29/15   Percell Miller Saguier, PA-C  oseltamivir (TAMIFLU) 75 MG capsule Take 1 capsule (75 mg total) by mouth 2 (two) times daily. 03/29/15   Mackie Pai, PA-C    Family History Family History  Problem Relation Age of  Onset  . Breast cancer Maternal Grandmother   . Cancer Maternal Grandmother     breast  . Hypertension Mother     maternal aunt  . Eczema Mother   . Eczema Sister   . Eczema Sister   . Cancer Other     breast cancer  . Prostate cancer Neg Hx   . Colon cancer Neg Hx   . Heart disease Neg Hx   . Diabetes Neg Hx   . Depression Neg Hx   . Stroke Neg Hx     Social History Social History  Substance Use Topics  . Smoking status: Never Smoker  . Smokeless tobacco: Never Used  . Alcohol use No     Allergies   Peanuts [peanut oil]   Review of Systems Review of Systems  Constitutional: Negative for chills and fever.  Respiratory: Positive for cough and shortness of breath.   Cardiovascular: Positive for chest pain.  Gastrointestinal: Negative for nausea and vomiting.  Neurological: Positive for headaches.  Psychiatric/Behavioral: Negative for confusion.  All other systems reviewed and are negative.    Physical Exam Updated Vital Signs BP 163/95 (BP Location: Right Arm)   Pulse 77   Temp 98.5 F (36.9 C) (Oral)   Resp 16   Ht 5\' 3"  (1.6 m)   Wt 150 lb (68 kg)   LMP 06/05/2014   SpO2 98%   BMI 26.57 kg/m   Physical Exam  CONSTITUTIONAL: Well developed/well nourished HEAD: Normocephalic/atraumatic EYES: EOMI/PERRL ENMT: Mucous membranes moist NECK: supple no meningeal signs SPINE/BACK:entire spine nontender CV: S1/S2 noted, no murmurs/rubs/gallops noted LUNGS: Lungs are clear to auscultation bilaterally, no apparent distress ABDOMEN: soft, nontender, no rebound or guarding, bowel sounds noted throughout abdomen GU:no cva tenderness NEURO: Pt is awake/alert/appropriate, moves all extremitiesx4.  No facial droop.   EXTREMITIES: pulses normal/equal, full ROM. No calf tenderness and no edema SKIN: warm, color normal PSYCH: no abnormalities of mood noted, alert and oriented to situation   ED Treatments / Results   DIAGNOSTIC STUDIES: Oxygen Saturation is 98%  on RA, Normal by my interpretation.    COORDINATION OF CARE: 12:20 AM- Will order CXR, blood work, and EKG. Discussed treatment plan with pt at bedside and pt agreed to plan.     Labs (all labs ordered are listed, but only abnormal results are displayed) Labs Reviewed  [place], ED    EKG  EKG Interpretation  Date/Time:  Monday October 16 2015 16:09:24 EDT Ventricular Rate:  77 PR Interval:  140 QRS Duration: 66 QT Interval:  362 QTC Calculation: 409 R Axis:   75 Text Interpretation:  Normal sinus rhythm Normal ECG No significant change since last tracing Confirmed by Christy Gentles  MD, [place] ([id number]) on 10/17/2015 12:10:25 AM       Radiology Dg Chest 2 View  Result Date: 10/16/2015 CLINICAL DATA:  Sinus headaches yesterday with chest tightness and shortness of breath. EXAM: CHEST  2 VIEW COMPARISON:  August 05 2014, CT chest April 2013, chest x-ray April 15, 2011 FINDINGS: The heart size and mediastinal contours are within normal limits. Stable right upper lobe pulmonary nodule is unchanged since 2013. There is no focal infiltrate, pulmonary edema, or pleural effusion. The visualized skeletal structures are stable. IMPRESSION: No active cardiopulmonary disease. Electronically Signed   By: Abelardo Diesel M.D.   On: 10/16/2015 16:43    Procedures Procedures (including critical care time)  Medications Ordered in ED Medications - No data to display   Initial Impression / Assessment and Plan / ED Course  I have reviewed the triage vital signs and the nursing notes.  Pertinent labs & imaging results that were available during my care of the patient were reviewed by me and considered in my medical decision making (see chart for details).  Clinical Course    Pt well appearing Pt reports symptoms started with recent cough/congestion Low risk for ACS She appears PERC negative She was improved by the time of my evaluation Will d/c  home   Final Clinical Impressions(s) / ED Diagnoses   Final diagnoses:  Chest pain, unspecified chest pain type    New Prescriptions New Prescriptions   No medications on file  I personally performed the services described in this documentation, which was scribed in my presence. The recorded information has been reviewed and is accurate.        Ripley Fraise, MD 10/17/15 [phone]

## 2015-11-21 ENCOUNTER — Encounter: Payer: Self-pay | Admitting: Family Medicine

## 2015-11-27 ENCOUNTER — Encounter: Payer: Self-pay | Admitting: Family Medicine

## 2016-03-26 ENCOUNTER — Encounter (HOSPITAL_BASED_OUTPATIENT_CLINIC_OR_DEPARTMENT_OTHER): Payer: Self-pay

## 2016-03-26 ENCOUNTER — Ambulatory Visit (INDEPENDENT_AMBULATORY_CARE_PROVIDER_SITE_OTHER): Payer: 59 | Admitting: Family Medicine

## 2016-03-26 ENCOUNTER — Encounter: Payer: Self-pay | Admitting: Family Medicine

## 2016-03-26 ENCOUNTER — Ambulatory Visit (HOSPITAL_BASED_OUTPATIENT_CLINIC_OR_DEPARTMENT_OTHER)
Admission: RE | Admit: 2016-03-26 | Discharge: 2016-03-26 | Disposition: A | Discharge status: Home / Self Care | Payer: 59 | Source: Ambulatory Visit | Attending: Family Medicine | Admitting: Family Medicine

## 2016-03-26 ENCOUNTER — Other Ambulatory Visit (HOSPITAL_COMMUNITY)
Admission: RE | Admit: 2016-03-26 | Discharge: 2016-03-26 | Disposition: A | Discharge status: Home / Self Care | Payer: 59 | Source: Ambulatory Visit | Attending: Family Medicine | Admitting: Family Medicine

## 2016-03-26 VITALS — BP 132/86 | HR 89 | Temp 98.9°F | Resp 18 | Wt 169.6 lb

## 2016-03-26 DIAGNOSIS — D509 Iron deficiency anemia, unspecified: Secondary | ICD-10-CM

## 2016-03-26 DIAGNOSIS — N760 Acute vaginitis: Secondary | ICD-10-CM | POA: Insufficient documentation

## 2016-03-26 DIAGNOSIS — E042 Nontoxic multinodular goiter: Secondary | ICD-10-CM

## 2016-03-26 DIAGNOSIS — M79604 Pain in right leg: Secondary | ICD-10-CM | POA: Diagnosis not present

## 2016-03-26 DIAGNOSIS — Z01419 Encounter for gynecological examination (general) (routine) without abnormal findings: Secondary | ICD-10-CM | POA: Insufficient documentation

## 2016-03-26 DIAGNOSIS — M545 Low back pain, unspecified: Secondary | ICD-10-CM

## 2016-03-26 DIAGNOSIS — Z113 Encounter for screening for infections with a predominantly sexual mode of transmission: Secondary | ICD-10-CM | POA: Insufficient documentation

## 2016-03-26 DIAGNOSIS — E049 Nontoxic goiter, unspecified: Secondary | ICD-10-CM | POA: Insufficient documentation

## 2016-03-26 DIAGNOSIS — Z124 Encounter for screening for malignant neoplasm of cervix: Secondary | ICD-10-CM

## 2016-03-26 DIAGNOSIS — Z1151 Encounter for screening for human papillomavirus (HPV): Secondary | ICD-10-CM | POA: Diagnosis not present

## 2016-03-26 DIAGNOSIS — M5441 Lumbago with sciatica, right side: Secondary | ICD-10-CM

## 2016-03-26 DIAGNOSIS — Z0000 Encounter for general adult medical examination without abnormal findings: Secondary | ICD-10-CM

## 2016-03-26 DIAGNOSIS — G8929 Other chronic pain: Secondary | ICD-10-CM

## 2016-03-26 DIAGNOSIS — R748 Abnormal levels of other serum enzymes: Secondary | ICD-10-CM

## 2016-03-26 DIAGNOSIS — Z1231 Encounter for screening mammogram for malignant neoplasm of breast: Secondary | ICD-10-CM | POA: Insufficient documentation

## 2016-03-26 DIAGNOSIS — M79605 Pain in left leg: Secondary | ICD-10-CM | POA: Diagnosis not present

## 2016-03-26 DIAGNOSIS — N761 Subacute and chronic vaginitis: Secondary | ICD-10-CM | POA: Diagnosis not present

## 2016-03-26 DIAGNOSIS — M5442 Lumbago with sciatica, left side: Secondary | ICD-10-CM

## 2016-03-26 DIAGNOSIS — Z1239 Encounter for other screening for malignant neoplasm of breast: Secondary | ICD-10-CM

## 2016-03-26 DIAGNOSIS — K219 Gastro-esophageal reflux disease without esophagitis: Secondary | ICD-10-CM

## 2016-03-26 HISTORY — DX: Acute vaginitis: N76.0

## 2016-03-26 HISTORY — DX: Low back pain, unspecified: M54.50

## 2016-03-26 HISTORY — DX: Nontoxic goiter, unspecified: E04.9

## 2016-03-26 LAB — URINALYSIS
Bilirubin Urine: NEGATIVE
Hgb urine dipstick: NEGATIVE
Ketones, ur: NEGATIVE
Leukocytes, UA: NEGATIVE
Nitrite: NEGATIVE
Specific Gravity, Urine: >=1.03 — AB (ref 1.000–1.030)
Total Protein, Urine: NEGATIVE
Urine Glucose: NEGATIVE
Urobilinogen, UA: 0.2 (ref 0.0–1.0)
pH: 5.5 (ref 5.0–8.0)

## 2016-03-26 LAB — COMPREHENSIVE METABOLIC PANEL
ALT: 11 U/L (ref 0–35)
AST: 12 U/L (ref 0–37)
Albumin: 4.1 g/dL (ref 3.5–5.2)
Alkaline Phosphatase: 85 U/L (ref 39–117)
BUN: 13 mg/dL (ref 6–23)
CO2: 31 mEq/L (ref 19–32)
Calcium: 9.6 mg/dL (ref 8.4–10.5)
Chloride: 103 mEq/L (ref 96–112)
Creatinine, Ser: 0.68 mg/dL (ref 0.40–1.20)
GFR: 118 mL/min (ref >60.00)
Glucose, Bld: 92 mg/dL (ref 70–99)
Potassium: 3.9 mEq/L (ref 3.5–5.1)
Sodium: 138 mEq/L (ref 135–145)
Total Bilirubin: 0.8 mg/dL (ref 0.2–1.2)
Total Protein: 7.8 g/dL (ref 6.0–8.3)

## 2016-03-26 LAB — CBC
HCT: 41.4 % (ref 36.0–46.0)
Hemoglobin: 13.8 g/dL (ref 12.0–15.0)
MCHC: 33.2 g/dL (ref 30.0–36.0)
MCV: 89.5 fl (ref 78.0–100.0)
Platelets: 356 10*3/uL (ref 150.0–400.0)
RBC: 4.62 Mil/uL (ref 3.87–5.11)
RDW: 13.6 % (ref 11.5–15.5)
WBC: 4.6 10*3/uL (ref 4.0–10.5)

## 2016-03-26 LAB — LIPID PANEL
Cholesterol: 197 mg/dL (ref 0–200)
HDL: 71 mg/dL (ref >39.00)
LDL Cholesterol: 116 mg/dL — ABNORMAL HIGH (ref 0–99)
NonHDL: 126.43
Total CHOL/HDL Ratio: 3
Triglycerides: 54 mg/dL (ref 0.0–149.0)
VLDL: 10.8 mg/dL (ref 0.0–40.0)

## 2016-03-26 LAB — TSH: TSH: 0.69 u[IU]/mL (ref 0.35–4.50)

## 2016-03-26 NOTE — Assessment & Plan Note (Signed)
Mgm ordered today

## 2016-03-26 NOTE — Assessment & Plan Note (Signed)
Avoid offending foods, start probiotics. Do not eat large meals in late evening and consider raising head of bed. Had a recent flare but she connected it

## 2016-03-26 NOTE — Progress Notes (Signed)
Subjective:  I acted as a Education administrator for Dr. Charlett Blake. [place], [place]   Patient ID: Anne Huffman, female    DOB: 20-Jul-1965, 51 y.o.   MRN: [id number]  Chief Complaint  Patient presents with  . Annual Exam    with pap smear  . Leg Pain    Leg Pain   There was no injury mechanism. The pain is present in the left leg, right leg, left knee and right knee. The quality of the pain is described as aching and cramping. She has tried nothing for the symptoms.    Patient is in today for an annual exam. Patient complains of bilateral leg pain. On further questioning she endorses low back aching frequently as well. Denies incontinence. No trauma or falls. Describes no pattern sometimes she is achy in am upon arising and sometimes it occurs after along shift on her feet. Denies CP/palp/SOB/HA/congestion/fevers/GI or GU c/o. Taking meds as prescribed  Patient Care Team: Mosie Lukes, MD as PCP - General (Family Medicine)   Past Medical History:  Diagnosis Date  . Allergy   . Asthma    since childhood  . Decreased visual acuity 04/21/2014  . Depression    no treatment  . Enlarged thyroid gland 03/26/2016  . Fatigue 08/20/2012  . Gastroenteritis 01/01/2015  . H. pylori infection 06/09/2013  . History of chicken pox    childhood  . Low back pain 03/26/2016  . Mild intermittent asthma 03/11/2011  . Preventative health care 09/21/2012  . Seasonal allergies   . Ulcer ([place])    stomach  . Vaginitis 03/26/2016    Past Surgical History:  Procedure Laterality Date  . NO PAST SURGERIES  03/11/2011    Family History  Problem Relation Age of Onset  . Breast cancer Maternal Grandmother   . Cancer Maternal Grandmother     breast  . Hypertension Mother     maternal aunt  . Eczema Mother   . Eczema Sister   . Eczema Sister   . Cancer Other     breast cancer  . Prostate cancer Neg Hx   . Colon cancer Neg Hx   . Heart disease Neg Hx   . Diabetes Neg Hx   . Depression Neg Hx   . Stroke Neg Hx      Social History   Social History  . Marital status: Single    Spouse name: N/A  . Number of children: N/A  . Years of education: N/A   Occupational History  . Not on file.   Social History Main Topics  . Smoking status: Never Smoker  . Smokeless tobacco: Never Used  . Alcohol use No  . Drug use: No  . 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 Not on file   Social History Narrative   CNA    Outpatient Medications Prior to Visit  Medication Sig Dispense Refill  . albuterol (PROVENTIL HFA;VENTOLIN HFA) 108 (90 Base) MCG/ACT inhaler Inhale 2 puffs into the lungs every 6 (six) hours as needed for wheezing or shortness of breath. (Patient not taking: Reported on 03/26/2016) 1 Inhaler 0  . azithromycin (ZITHROMAX) 250 MG tablet Take 2 tablets by mouth on day 1, followed by 1 tablet by mouth daily for 4 days. 6 tablet 0  . benzonatate (TESSALON) 100 MG capsule Take 1 capsule (100 mg total) by mouth 3 (  three) times daily as needed. 30 capsule 0  . fluticasone (FLONASE) 50 MCG/ACT nasal spray Place 2 sprays into both nostrils daily. 16 g 1  . fluticasone (FLONASE) 50 MCG/ACT nasal spray Place 2 sprays into both nostrils daily. 16 g 1  . oseltamivir (TAMIFLU) 75 MG capsule Take 1 capsule (75 mg total) by mouth 2 (two) times daily. 10 capsule 0   No facility-administered medications prior to visit.     Allergies  Allergen Reactions  . Peanuts [Peanut Oil] Itching    Review of Systems  Constitutional: Negative for fever and malaise/fatigue.  HENT: Negative for congestion.   Eyes: Negative for blurred vision.  Respiratory: Negative for cough and shortness of breath.   Cardiovascular: Negative for chest pain, palpitations and leg swelling.  Gastrointestinal: Negative for vomiting.  Musculoskeletal: Positive for back pain and myalgias.  Skin: Negative for rash.  Neurological:  Negative for loss of consciousness and headaches.       Objective:    Physical Exam  Constitutional: She is oriented to person, place, and time. She appears well-developed and well-nourished. No distress.  HENT:  Head: Normocephalic and atraumatic.  Eyes: Conjunctivae are normal.  Neck: Normal range of motion. No thyromegaly present.  Cardiovascular: Normal rate and regular rhythm.   Pulmonary/Chest: Effort normal and breath sounds normal. She has no wheezes.  Abdominal: Soft. Bowel sounds are normal. There is no tenderness.  Genitourinary: Vagina normal and uterus normal. No vaginal discharge found.  Musculoskeletal: Normal range of motion. She exhibits no edema or deformity.  Neurological: She is alert and oriented to person, place, and time.  Skin: Skin is warm and dry. She is not diaphoretic.  Psychiatric: She has a normal mood and affect.  Nursing note and vitals reviewed.   BP 132/86 (BP Location: Left Arm, Patient Position: Sitting, Cuff Size: Normal)   Pulse 89   Temp 98.9 F (37.2 C) (Oral)   Resp 18   Wt 169 lb 9.6 oz (76.9 kg)   LMP 10/27/2015   SpO2 98%   BMI 30.04 kg/m  Wt Readings from Last 3 Encounters:  03/26/16 169 lb 9.6 oz (76.9 kg)  10/16/15 150 lb (68 kg)  03/29/15 168 lb 6.4 oz (76.4 kg)     Lab Results  Component Value Date   WBC 9.0 10/16/2015   HGB 13.9 10/16/2015   HCT 42.4 10/16/2015   PLT 331 10/16/2015   GLUCOSE 93 10/16/2015   CHOL 187 04/08/2014   TRIG 31.0 04/08/2014   HDL 82.80 04/08/2014   LDLCALC 98 04/08/2014   ALT 15 12/26/2014   AST 17 12/26/2014   NA 142 10/16/2015   K 4.5 10/16/2015   CL 106 10/16/2015   CREATININE 0.80 10/16/2015   BUN 15 10/16/2015   CO2 27 10/16/2015   TSH 1.11 04/08/2014   HGBA1C 5.7 04/08/2014    Lab Results  Component Value Date   TSH 1.11 04/08/2014   Lab Results  Component Value Date   WBC 9.0 10/16/2015   HGB 13.9 10/16/2015   HCT 42.4 10/16/2015   MCV 89.5 10/16/2015   PLT 331  10/16/2015   Lab Results  Component Value Date   NA 142 10/16/2015   K 4.5 10/16/2015   CO2 27 10/16/2015   GLUCOSE 93 10/16/2015   BUN 15 10/16/2015   CREATININE 0.80 10/16/2015   BILITOT 0.8 12/26/2014   ALKPHOS 101 12/26/2014   AST 17 12/26/2014   ALT 15 12/26/2014   PROT 8.1 12/26/2014  ALBUMIN 4.2 12/26/2014   CALCIUM 9.5 10/16/2015   ANIONGAP 9 10/16/2015   GFR 115.06 04/08/2014   Lab Results  Component Value Date   CHOL 187 04/08/2014   Lab Results  Component Value Date   HDL 82.80 04/08/2014   Lab Results  Component Value Date   LDLCALC 98 04/08/2014   Lab Results  Component Value Date   TRIG 31.0 04/08/2014   Lab Results  Component Value Date   CHOLHDL 2 04/08/2014   Lab Results  Component Value Date   HGBA1C 5.7 04/08/2014       Assessment & Plan:   Problem List Items Addressed This Visit    Breast cancer screening    Mgm ordered today      Anemia    Increase leafy greens, consider increased lean red meat and using cast iron cookware. Continue to monitor, report any concerns. Check cbc      Abnormal liver enzymes    Check cmp today      Cervical cancer screening    Pap today, no concerns on exam, scant white discharge noted. No odor. Check culture for yeast and BV      Relevant Orders   Cytology - PAP   Preventative health care - Primary    Patient encouraged to maintain heart healthy diet, regular exercise, adequate sleep. Consider daily probiotics. Take medications as prescribed      Relevant Orders   CBC   Comprehensive metabolic panel   Lipid panel   TSH   Urinalysis   GERD (gastroesophageal reflux disease)    Avoid offending foods, start probiotics. Do not eat large meals in late evening and consider raising head of bed. Had a recent flare but she connected it       Enlarged thyroid gland    Left lobe enlarged asymptomatic ordered ultrasound today      Low back pain    Describes low back ache with achy anterior  thighs at times especially after a long day at work in [place] on her feet. Try lidocaine patches and weight loss. Xray today report worsening symptoms      Relevant Orders   DG Lumbar Spine 2-3 Views (Completed)   Vaginitis    White discharge and some itching, encouraged to avoid bodywash, add probiotics and consider wipe with vinegar after showering       Other Visit Diagnoses    Encounter for screening mammogram for breast cancer       Relevant Orders   MM SCREENING BREAST TOMO BILATERAL   Goiter       Relevant Orders   US THYROID   Pain in both lower extremities       Relevant Orders   DG Lumbar Spine 2-3 Views (Completed)      I have discontinued Ms. Sorey's fluticasone, oseltamivir, azithromycin, benzonatate, and fluticasone. I am also having her maintain her albuterol.  No orders of the defined types were placed in this encounter.   CMA served as Education administrator during this visit. History, Physical and Plan performed by medical provider. Documentation and orders reviewed and attested to.  Penni Homans, MD

## 2016-03-26 NOTE — Patient Instructions (Signed)
Try Lidocaine patches for lower back pain  "NOW" Probiotic here at [facility] Preventive care refers to lifestyle choices and visits with your health care provider that can promote health and wellness. What does preventive care include?  A yearly physical exam. This is also called an annual well check.  Dental exams once or twice a year.  Routine eye exams. Ask your health care provider how often you should have your eyes checked.  Personal lifestyle choices, including:  Daily care of your teeth and gums.  Regular physical activity.  Eating a healthy diet.  Avoiding tobacco and drug use.  Limiting alcohol use.  Practicing safe sex.  Taking low-dose aspirin daily starting at age 51.  Taking vitamin and mineral supplements as recommended by your health care provider. What happens during an annual well check? The services and screenings done by your health care provider during your annual well check will depend on your age, overall health, lifestyle risk factors, and family history of disease. Counseling  Your health care provider may ask you questions about your:  Alcohol use.  Tobacco use.  Drug use.  Emotional well-being.  Home and relationship well-being.  Sexual activity.  Eating habits.  Work and work Statistician.  Method of birth control.  Menstrual cycle.  Pregnancy history. Screening  You may have the following tests or measurements:  Height, weight, and BMI.  Blood pressure.  Lipid and cholesterol levels. These may be checked every 5 years, or more frequently if you are over 93 years old.  Skin check.  Lung cancer screening. You may have this screening every year starting at age 49 if you have a 30-pack-year history of smoking and currently smoke or have quit within the past 15 years.  Fecal occult blood test (FOBT) of the stool. You may have this test every year starting at age  15.  Flexible sigmoidoscopy or colonoscopy. You may have a sigmoidoscopy every 5 years or a colonoscopy every 10 years starting at age 57.  Hepatitis C blood test.  Hepatitis B blood test.  Sexually transmitted disease (STD) testing.  Diabetes screening. This is done by checking your blood sugar (glucose) after you have not eaten for a while (fasting). You may have this done every 1-3 years.  Mammogram. This may be done every 1-2 years. Talk to your health care provider about when you should start having regular mammograms. This may depend on whether you have a family history of breast cancer.  BRCA-related cancer screening. This may be done if you have a family history of breast, ovarian, tubal, or peritoneal cancers.  Pelvic exam and Pap test. This may be done every 3 years starting at age 74. Starting at age 91, this may be done every 5 years if you have a Pap test in combination with an HPV test.  Bone density scan. This is done to screen for osteoporosis. You may have this scan if you are at high risk for osteoporosis. Discuss your test results, treatment options, and if necessary, the need for more tests with your health care provider. Vaccines  Your health care provider may recommend certain vaccines, such as:  Influenza vaccine. This is recommended every year.  Tetanus, diphtheria, and acellular pertussis (Tdap, Td) vaccine. You may need a Td booster every 10 years.  Varicella vaccine. You may need this if you have not been vaccinated.  Zoster vaccine. You may need this after age 33.  Measles, mumps, and rubella (MMR) vaccine. You may need at least one dose of MMR if you were born in 1957 or later. You may also need a second dose.  Pneumococcal 13-valent conjugate (PCV13) vaccine. You may need this if you have certain conditions and were not previously vaccinated.  Pneumococcal polysaccharide (PPSV23) vaccine. You may need one or two doses if you smoke cigarettes or if you  have certain conditions.  Meningococcal vaccine. You may need this if you have certain conditions.  Hepatitis A vaccine. You may need this if you have certain conditions or if you travel or work in places where you may be exposed to hepatitis A.  Hepatitis B vaccine. You may need this if you have certain conditions or if you travel or work in places where you may be exposed to hepatitis B.  Haemophilus influenzae type b (Hib) vaccine. You may need this if you have certain conditions. Talk to your health care provider about which screenings and vaccines you need and how often you need them. This information is not intended to replace advice given to you by your health care provider. Make sure you discuss any questions you have with your health care provider. Document Released: 02/03/2015 Document Revised: 09/27/2015 Document Reviewed: 11/08/2014 Elsevier Interactive Patient Education  2017 [employer].

## 2016-03-26 NOTE — Assessment & Plan Note (Signed)
Left lobe enlarged asymptomatic ordered ultrasound today

## 2016-03-26 NOTE — Assessment & Plan Note (Signed)
White discharge and some itching, encouraged to avoid bodywash, add probiotics and consider wipe with vinegar after showering

## 2016-03-26 NOTE — Assessment & Plan Note (Signed)
Check cmp today 

## 2016-03-26 NOTE — Assessment & Plan Note (Signed)
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 Try lidocaine patches and weight loss. Xray today report worsening symptoms

## 2016-03-26 NOTE — Assessment & Plan Note (Signed)
Patient encouraged to maintain heart healthy diet, regular exercise, adequate sleep. Consider daily probiotics. Take medications as prescribed 

## 2016-03-26 NOTE — Progress Notes (Signed)
Pre visit review using our clinic review tool, if applicable. No additional management support is needed unless otherwise documented below in the visit note. 

## 2016-03-26 NOTE — Assessment & Plan Note (Addendum)
Pap today, no concerns on exam, scant white discharge noted. No odor. Check culture for yeast and BV

## 2016-03-26 NOTE — Assessment & Plan Note (Signed)
Increase leafy greens, consider increased lean red meat and using cast iron cookware. Continue to monitor, report any concerns. Check cbc  

## 2016-03-27 LAB — CYTOLOGY - PAP
Adequacy: ABSENT
Bacterial vaginitis: NEGATIVE
Candida vaginitis: NEGATIVE
Diagnosis: NEGATIVE
HPV: NOT DETECTED

## 2016-08-02 ENCOUNTER — Ambulatory Visit: Payer: 59 | Admitting: Family Medicine

## 2016-08-02 ENCOUNTER — Telehealth: Payer: Self-pay | Admitting: Family Medicine

## 2016-08-02 DIAGNOSIS — Z0289 Encounter for other administrative examinations: Secondary | ICD-10-CM

## 2016-08-02 NOTE — Telephone Encounter (Signed)
charge 

## 2016-08-02 NOTE — Telephone Encounter (Signed)
Patient lvm at 11:46am cancelling 1pm appointment for today, charge or no charge

## 2016-08-06 NOTE — Progress Notes (Deleted)
[place] [facility] [address], [place], [place] [address] [phone]  Date:  08/08/2016   Name:  Anne Huffman   DOB:  05-13-1965   MRN:  [id number]  PCP:  Mosie Lukes, MD    Chief Complaint: No chief complaint on file.   History of Present Illness:  Anne Huffman is a 51 y.o. very pleasant female patient who presents with the following:  Concern of vaginal irritation today   Patient Active Problem List   Diagnosis Date Noted  . Enlarged thyroid gland 03/26/2016  . Low back pain 03/26/2016  . Vaginitis 03/26/2016  . Decreased visual acuity 04/21/2014  . GERD (gastroesophageal reflux disease) 06/09/2013  . Preventative health care 09/21/2012  . Cervical cancer screening 09/18/2012  . Fatigue 08/20/2012  . Dermatitis 07/18/2011  . Lung nodule 05/16/2011  . Neck pain on right side 04/06/2011  . Radicular pain in right arm 04/06/2011  . Breast cancer screening 03/11/2011  . Anemia 03/11/2011  . Abnormal liver enzymes 03/11/2011  . Mild intermittent asthma 03/11/2011    Past Medical History:  Diagnosis Date  . Allergy   . Asthma    since childhood  . Decreased visual acuity 04/21/2014  . Depression    no treatment  . Enlarged thyroid gland 03/26/2016  . Fatigue 08/20/2012  . Gastroenteritis 01/01/2015  . H. pylori infection 06/09/2013  . History of chicken pox    childhood  . Low back pain 03/26/2016  . Mild intermittent asthma 03/11/2011  . Preventative health care 09/21/2012  . Seasonal allergies   . Ulcer ([place])    stomach  . Vaginitis 03/26/2016    Past Surgical History:  Procedure Laterality Date  . NO PAST SURGERIES  03/11/2011    Social History  Substance Use Topics  . Smoking status: Never Smoker  . Smokeless tobacco: Never Used  . Alcohol use No    Family History  Problem Relation Age of Onset  . Breast cancer Maternal Grandmother   . Cancer Maternal Grandmother        breast  .  Hypertension Mother        maternal aunt  . Eczema Mother   . Eczema Sister   . Eczema Sister   . Cancer Other        breast cancer  . Prostate cancer Neg Hx   . Colon cancer Neg Hx   . Heart disease Neg Hx   . Diabetes Neg Hx   . Depression Neg Hx   . Stroke Neg Hx     Allergies  Allergen Reactions  . Peanuts [Peanut Oil] Itching    Medication list has been reviewed and updated.  Current Outpatient Prescriptions on File Prior to Visit  Medication Sig Dispense Refill  . albuterol (PROVENTIL HFA;VENTOLIN HFA) 108 (90 Base) MCG/ACT inhaler Inhale 2 puffs into the lungs every 6 (six) hours as needed for wheezing or shortness of breath. (Patient not taking: Reported on 03/26/2016) 1 Inhaler 0   No current facility-administered medications on file prior to visit.     Review of Systems:  ***  Physical Examination: There were no vitals filed for this visit. There were no vitals filed for this visit. There is no height or weight on file to calculate BMI. Ideal Body Weight:    ***  Assessment and Plan: ***  Signed Lamar Blinks, MD

## 2016-08-08 ENCOUNTER — Ambulatory Visit: Payer: 59 | Admitting: Family Medicine

## 2016-08-08 DIAGNOSIS — Z0289 Encounter for other administrative examinations: Secondary | ICD-10-CM

## 2016-09-03 ENCOUNTER — Ambulatory Visit: Payer: 59 | Admitting: Family Medicine

## 2016-09-03 DIAGNOSIS — Z0289 Encounter for other administrative examinations: Secondary | ICD-10-CM

## 2017-03-10 ENCOUNTER — Encounter: Payer: Self-pay | Admitting: Medical

## 2017-03-10 ENCOUNTER — Ambulatory Visit (INDEPENDENT_AMBULATORY_CARE_PROVIDER_SITE_OTHER): Payer: Self-pay | Admitting: Medical

## 2017-03-10 VITALS — BP 130/79 | HR 84 | Temp 98.8°F | Resp 16 | Ht 63.0 in | Wt 177.0 lb

## 2017-03-10 DIAGNOSIS — R059 Cough, unspecified: Secondary | ICD-10-CM

## 2017-03-10 DIAGNOSIS — R05 Cough: Secondary | ICD-10-CM

## 2017-03-10 DIAGNOSIS — J40 Bronchitis, not specified as acute or chronic: Secondary | ICD-10-CM

## 2017-03-10 DIAGNOSIS — J3489 Other specified disorders of nose and nasal sinuses: Secondary | ICD-10-CM

## 2017-03-10 DIAGNOSIS — B349 Viral infection, unspecified: Secondary | ICD-10-CM

## 2017-03-10 LAB — POCT INFLUENZA A/B
Influenza A, POC: NEGATIVE
Influenza B, POC: NEGATIVE

## 2017-03-10 MED ORDER — FLUTICASONE PROPIONATE 50 MCG/ACT NA SUSP
2.0000 | Freq: Every day | NASAL | 1 refills | Status: DC
Start: 2017-03-10 — End: 2017-08-06

## 2017-03-10 MED ORDER — AZITHROMYCIN 250 MG PO TABS: ORAL_TABLET | ORAL | 0 refills | Status: DC

## 2017-03-10 MED ORDER — OSELTAMIVIR PHOSPHATE 75 MG PO CAPS: 75.0000 mg | ORAL_CAPSULE | Freq: Two times a day (BID) | ORAL | 0 refills | Status: DC

## 2017-03-10 MED ORDER — BENZONATATE 100 MG PO CAPS: 100.0000 mg | ORAL_CAPSULE | Freq: Three times a day (TID) | ORAL | 0 refills | Status: DC | PRN

## 2017-03-10 MED FILL — BENZONATATE 100 MG CAPSULE: 100 | 7 days supply | Qty: 21 | Fill #0

## 2017-03-10 MED FILL — AZITHROMYCIN 250 MG TABLET: 250 | 5 days supply | Qty: 6 | Fill #0

## 2017-03-10 NOTE — Patient Instructions (Signed)
Your flu test was negative but you do have viral syndrome type signs and symptoms.  Based on some of your suspicious symptoms I am making Tamiflu prescription available.  Benefits, risk and cost of Tamiflu discussed.  I do think you have symptoms of bronchitis and some concern about sinus infection based on recent sinus pressure.  I am going to prescribe Flonase for nasal congestion.  Benzonatate for cough.  Also prescribing a azithromycin antibiotic.  Try to rest and keep yourself well-hydrated.  Can use Tylenol or ibuprofen for body aches.  Follow-up in 7 days or as needed.

## 2017-03-10 NOTE — Progress Notes (Signed)
Subjective:    Patient ID: Anne Huffman, female    DOB: 1965-05-29, 52 y.o.   MRN: [id number]  HPI  Pt in feeling sick since Friday. Pt was feeling fine but Friday am had body aches in thighs on Friday. By Saturday had diffuse body aches all over. Very fatigue and had fevers. Pt had some intermittent productive cough. Early on some sinus pressure. Some ha.  Pt had no flu vaccine this year.   No wheezing.    Review of Systems  Constitutional: Positive for fatigue and fever. Negative for chills.  Respiratory: Positive for cough. Negative for chest tightness, shortness of breath and wheezing.   Cardiovascular: Negative for chest pain and palpitations.  Gastrointestinal: Negative for abdominal pain, constipation, diarrhea and vomiting.  Musculoskeletal: Positive for myalgias. Negative for back pain and joint swelling.  Neurological: Positive for headaches. Negative for dizziness, speech difficulty, weakness, light-headedness and numbness.  Hematological: Negative for adenopathy. Does not bruise/bleed easily.  Psychiatric/Behavioral: Negative for behavioral problems and hallucinations. The patient is not nervous/anxious.    Past Medical History:  Diagnosis Date  . Allergy   . Asthma    since childhood  . Decreased visual acuity 04/21/2014  . Depression    no treatment  . Enlarged thyroid gland 03/26/2016  . Fatigue 08/20/2012  . Gastroenteritis 01/01/2015  . H. pylori infection 06/09/2013  . History of chicken pox    childhood  . Low back pain 03/26/2016  . Mild intermittent asthma 03/11/2011  . Preventative health care 09/21/2012  . Seasonal allergies   . Ulcer    stomach  . Vaginitis 03/26/2016     Social History   Socioeconomic History  . Marital status: Single    Spouse name: Not on file  . Number of children: Not on file  . Years of education: Not on file  . Highest education level: Not on file  Social Needs  . Financial resource strain: Not on file  . Food insecurity  - worry: Not on file  . Food insecurity - inability: Not on file  . Transportation needs - medical: Not on file  . Transportation needs - non-medical: Not on file  Occupational History  . Not on file  Tobacco Use  . Smoking status: Never Smoker  . Smokeless tobacco: Never Used  Substance and Sexual Activity  . Alcohol use: No  . Drug use: No  . 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 Not on file  Social History Narrative   CNA    Past Surgical History:  Procedure Laterality Date  . NO PAST SURGERIES  03/11/2011    Family History  Problem Relation Age of Onset  . Breast cancer Maternal Grandmother   . Cancer Maternal Grandmother        breast  . Hypertension Mother        maternal aunt  . Eczema Mother   . Eczema Sister   . Eczema Sister   . Cancer Other        breast cancer  . Prostate cancer Neg Hx   . Colon cancer Neg Hx   . Heart disease Neg Hx   . Diabetes Neg Hx   . Depression Neg Hx   . Stroke Neg Hx     Allergies  Allergen Reactions  . Peanuts [Peanut Oil] Itching    Current Outpatient Medications on File Prior  to Visit  Medication Sig Dispense Refill  . albuterol (PROVENTIL HFA;VENTOLIN HFA) 108 (90 Base) MCG/ACT inhaler Inhale 2 puffs into the lungs every 6 (six) hours as needed for wheezing or shortness of breath. 1 Inhaler 0   No current facility-administered medications on file prior to visit.     BP 130/79   Pulse 84   Temp 98.8 F (37.1 C) (Oral)   Resp 16   Ht 5\' 3"  (1.6 m)   Wt 177 lb (80.3 kg)   SpO2 99%   BMI 31.35 kg/m       Objective:   Physical Exam General  Mental Status - Alert. General Appearance - Well groomed. Not in acute distress.  Skin Rashes- No Rashes.  HEENT Head- Normal. Ear Auditory Canal - Left- Normal. Right - Normal.Tympanic Membrane- Left- Normal. Right- Normal. Eye Sclera/Conjunctiva- Left- Normal.  Right- Normal. Nose & Sinuses Nasal Mucosa- Left-  Boggy and Congested. Right-  Boggy and  Congested.Bilateral   Maxillary pressure but   No frontal sinus pressure. Mouth & Throat Lips: Upper Lip- Normal: no dryness, cracking, pallor, cyanosis, or vesicular eruption. Lower Lip-Normal: no dryness, cracking, pallor, cyanosis or vesicular eruption. Buccal Mucosa- Bilateral- No Aphthous ulcers. Oropharynx- No Discharge or Erythema. Tonsils: Characteristics- Bilateral- No erythema or Congestion. Size/Enlargement- Bilateral- No enlargement. Discharge- bilateral-None.  Neck Neck- Supple. No Masses.   Chest and Lung Exam Auscultation: Breath Sounds:-Clear even and unlabored.  Cardiovascular Auscultation:Rythm- Regular, rate and rhythm. Murmurs & Other Heart Sounds:Ausculatation of the heart reveal- No Murmurs.  Lymphatic Head & Neck General Head & Neck Lymphatics: Bilateral: Description- No Localized lymphadenopathy.        Assessment & Plan:  Your flu test was negative but you do have viral syndrome type signs and symptoms.  Based on some of your suspicious symptoms I am making Tamiflu prescription available.  Benefits, risk and cost of Tamiflu discussed.  I do think you have symptoms of bronchitis and some concern about sinus infection based on recent sinus pressure.  I am going to prescribe Flonase for nasal congestion.  Benzonatate for cough.  Also prescribing a azithromycin antibiotic.  Try to rest and keep yourself well-hydrated.  Can use Tylenol or ibuprofen for body aches.  Follow-up in 7 days or as needed.  Did discuss with patient her return to work status.  She is afebrile presently and some of her former symptoms on the weekend are improving.  However if she does spike fevers again and gets severe diffuse body aches would recommend she stay at work.

## 2017-04-01 ENCOUNTER — Encounter: Payer: 59 | Admitting: Family Medicine

## 2017-04-06 ENCOUNTER — Encounter (HOSPITAL_COMMUNITY): Payer: Self-pay

## 2017-04-06 ENCOUNTER — Other Ambulatory Visit: Payer: Self-pay

## 2017-04-06 DIAGNOSIS — M542 Cervicalgia: Secondary | ICD-10-CM | POA: Insufficient documentation

## 2017-04-06 DIAGNOSIS — Z79899 Other long term (current) drug therapy: Secondary | ICD-10-CM | POA: Insufficient documentation

## 2017-04-06 DIAGNOSIS — Z91010 Allergy to peanuts: Secondary | ICD-10-CM | POA: Insufficient documentation

## 2017-04-06 DIAGNOSIS — G44209 Tension-type headache, unspecified, not intractable: Secondary | ICD-10-CM | POA: Insufficient documentation

## 2017-04-06 DIAGNOSIS — J45909 Unspecified asthma, uncomplicated: Secondary | ICD-10-CM | POA: Insufficient documentation

## 2017-04-06 DIAGNOSIS — F329 Major depressive disorder, single episode, unspecified: Secondary | ICD-10-CM | POA: Insufficient documentation

## 2017-04-06 NOTE — ED Triage Notes (Signed)
Patient from home after having headache that began this am.  Neck pain also endorsed, can bend and twist neck but is painful.  No neuro deficits.  A&Ox4 vitals stable.

## 2017-04-07 ENCOUNTER — Emergency Department (HOSPITAL_COMMUNITY)
Admission: EM | Admit: 2017-04-07 | Discharge: 2017-04-07 | Disposition: A | Discharge status: Home / Self Care | Payer: Self-pay | Attending: Emergency Medicine | Admitting: Emergency Medicine

## 2017-04-07 DIAGNOSIS — G44209 Tension-type headache, unspecified, not intractable: Secondary | ICD-10-CM

## 2017-04-07 DIAGNOSIS — M542 Cervicalgia: Secondary | ICD-10-CM

## 2017-04-07 MED ORDER — IBUPROFEN 600 MG PO TABS: 600.0000 mg | ORAL_TABLET | Freq: Four times a day (QID) | ORAL | 0 refills | Status: DC | PRN

## 2017-04-07 MED ORDER — IBUPROFEN 800 MG PO TABS
800.0000 mg | ORAL_TABLET | Freq: Once | ORAL | Status: AC
  Administered 2017-04-07: 800 mg via ORAL
  Filled 2017-04-07: qty 1

## 2017-04-07 MED ORDER — CYCLOBENZAPRINE HCL 10 MG PO TABS
10.0000 mg | ORAL_TABLET | Freq: Two times a day (BID) | ORAL | 0 refills | Status: DC | PRN
Start: 2017-04-07 — End: 2017-08-06

## 2017-04-07 NOTE — ED Provider Notes (Signed)
[facility] EMERGENCY DEPARTMENT Provider Note   CSN: [id number] Arrival date & time: 04/06/17  2002     History   Chief Complaint Chief Complaint  Patient presents with  . Headache    HPI Anne Huffman is a 52 y.o. female.  Patient presents with complaint of posterior neck and occipital pain for the past one day. No known strain or injury. No radiation of the pain into the extremities. She reports the symptoms are worse when she turns her head. No fever, nausea or vomiting. No visual changes, difficulty speaking or walking. No similar symptoms in the past.    The history is provided by the patient. No language interpreter was used.  Headache   Pertinent negatives include no fever, no shortness of breath, no nausea and no vomiting.    Past Medical History:  Diagnosis Date  . Allergy   . Asthma    since childhood  . Decreased visual acuity 04/21/2014  . Depression    no treatment  . Enlarged thyroid gland 03/26/2016  . Fatigue 08/20/2012  . Gastroenteritis 01/01/2015  . H. pylori infection 06/09/2013  . History of chicken pox    childhood  . Low back pain 03/26/2016  . Mild intermittent asthma 03/11/2011  . Preventative health care 09/21/2012  . Seasonal allergies   . Ulcer    stomach  . Vaginitis 03/26/2016    Patient Active Problem List   Diagnosis Date Noted  . Enlarged thyroid gland 03/26/2016  . Low back pain 03/26/2016  . Vaginitis 03/26/2016  . Decreased visual acuity 04/21/2014  . GERD (gastroesophageal reflux disease) 06/09/2013  . Preventative health care 09/21/2012  . Cervical cancer screening 09/18/2012  . Fatigue 08/20/2012  . Dermatitis 07/18/2011  . Lung nodule 05/16/2011  . Neck pain on right side 04/06/2011  . Radicular pain in right arm 04/06/2011  . Breast cancer screening 03/11/2011  . Anemia 03/11/2011  . Abnormal liver enzymes 03/11/2011  . Mild intermittent asthma 03/11/2011    Past Surgical History:  Procedure  Laterality Date  . NO PAST SURGERIES  03/11/2011    OB History    No data available       Home Medications    Prior to Admission medications   Medication Sig Start Date End Date Taking? Authorizing Provider  albuterol (PROVENTIL HFA;VENTOLIN HFA) 108 (90 Base) MCG/ACT inhaler Inhale 2 puffs into the lungs every 6 (six) hours as needed for wheezing or shortness of breath. 03/09/15   Saguier, Percell Miller, PA-C  azithromycin (ZITHROMAX) 250 MG tablet Take 2 tablets by mouth on day 1, followed by 1 tablet by mouth daily for 4 days. 03/10/17   Saguier, Percell Miller, PA-C  benzonatate (TESSALON) 100 MG capsule Take 1 capsule (100 mg total) by mouth 3 (three) times daily as needed. 03/10/17   Saguier, Percell Miller, PA-C  cyclobenzaprine (FLEXERIL) 10 MG tablet Take 1 tablet (10 mg total) by mouth 2 (two) times daily as needed for muscle spasms. 04/07/17   Charlann Lange, PA-C  fluticasone (FLONASE) 50 MCG/ACT nasal spray Place 2 sprays into both nostrils daily. 03/10/17   Saguier, Percell Miller, PA-C  ibuprofen (ADVIL,MOTRIN) 600 MG tablet Take 1 tablet (600 mg total) by mouth every 6 (six) hours as needed. 04/07/17   Charlann Lange, PA-C  oseltamivir (TAMIFLU) 75 MG capsule Take 1 capsule (75 mg total) by mouth 2 (two) times daily. 03/10/17   Saguier, Percell Miller, PA-C    Family History Family History  Problem Relation Age of Onset  .  Breast cancer Maternal Grandmother   . Cancer Maternal Grandmother        breast  . Hypertension Mother        maternal aunt  . Eczema Mother   . Eczema Sister   . Eczema Sister   . Cancer Other        breast cancer  . Prostate cancer Neg Hx   . Colon cancer Neg Hx   . Heart disease Neg Hx   . Diabetes Neg Hx   . Depression Neg Hx   . Stroke Neg Hx     Social History Social History   Tobacco Use  . Smoking status: Never Smoker  . Smokeless tobacco: Never Used  Substance Use Topics  . Alcohol use: No  . Drug use: No     Allergies   Peanuts [peanut oil]   Review of  Systems Review of Systems  Constitutional: Negative for chills and fever.  Respiratory: Negative.  Negative for cough and shortness of breath.   Cardiovascular: Negative.  Negative for chest pain.  Gastrointestinal: Negative.  Negative for nausea and vomiting.  Musculoskeletal: Positive for neck pain. Negative for back pain.  Skin: Negative.   Neurological: Positive for headaches. Negative for facial asymmetry, speech difficulty and weakness.     Physical Exam Updated Vital Signs BP (!) 158/75 (BP Location: Right Arm)   Pulse 71   Temp 98.7 F (37.1 C) (Oral)   Resp 18   SpO2 98%   Physical Exam  Constitutional: She appears well-developed and well-nourished.  HENT:  Head: Normocephalic.    Neck: Normal range of motion. Neck supple.  Cardiovascular: Normal rate and regular rhythm.  Pulmonary/Chest: Effort normal and breath sounds normal.  Abdominal: Soft. Bowel sounds are normal. There is no tenderness. There is no rebound and no guarding.  Musculoskeletal: Normal range of motion.  No midline cervical tenderness. Grip strength in bilateral UE's is symmetric. FROM neck with increased symptoms when looking right. No swelling.  Neurological: She is alert. She has normal strength. She displays normal reflexes. GCS eye subscore is 4. GCS verbal subscore is 5. GCS motor subscore is 6.  CN's 3-12 grossly intact. Speech is clear and focused. No facial asymmetry. No lateralizing weakness. Reflexes are equal. No deficits of coordination. Ambulatory without imbalance.    Skin: Skin is warm and dry.  Psychiatric: She has a normal mood and affect.     ED Treatments / Results  Labs (all labs ordered are listed, but only abnormal results are displayed) Labs Reviewed - No data to display  EKG  EKG Interpretation None       Radiology No results found.  Procedures Procedures (including critical care time)  Medications Ordered in ED Medications  ibuprofen (ADVIL,MOTRIN)  tablet 800 mg (800 mg Oral Given 04/07/17 0115)     Initial Impression / Assessment and Plan / ED Course  I have reviewed the triage vital signs and the nursing notes.  Pertinent labs & imaging results that were available during my care of the patient were reviewed by me and considered in my medical decision making (see chart for details).     Patient presents with posterior neck pain and headache. Symptoms worse with movement. No neurologic deficits. Pain is reproducible to palpation.   Doubt nerve impingement or vascular source of pain. Symptoms more likely represent muscular neck pain and tension type headache.   Final Clinical Impressions(s) / ED Diagnoses   Final diagnoses:  Musculoskeletal neck pain  Tension  headache    ED Discharge Orders        Ordered    ibuprofen (ADVIL,MOTRIN) 600 MG tablet  Every 6 hours PRN     04/07/17 0108    cyclobenzaprine (FLEXERIL) 10 MG tablet  2 times daily PRN     04/07/17 0108       Charlann Lange, PA-C 04/07/17 0134    Ward, Delice Bison, DO 04/07/17 [phone]

## 2017-06-24 ENCOUNTER — Encounter: Payer: Self-pay | Admitting: Family Medicine

## 2017-07-03 ENCOUNTER — Telehealth: Payer: Self-pay | Admitting: Family Medicine

## 2017-07-03 NOTE — Telephone Encounter (Signed)
Copied from [place] [phone]. Topic: Quick Communication - See Telephone Encounter >> Jul 03, 2017  4:10 PM Genella Rife H wrote: CRM for notification. See Telephone encounter for: 07/03/17.  Left voicemail appt needs to be rescheduled per pcp.

## 2017-08-06 ENCOUNTER — Encounter (INDEPENDENT_AMBULATORY_CARE_PROVIDER_SITE_OTHER): Payer: Self-pay

## 2017-08-06 ENCOUNTER — Encounter

## 2017-08-06 ENCOUNTER — Encounter: Payer: Self-pay | Admitting: Family

## 2017-08-06 ENCOUNTER — Encounter: Payer: Self-pay | Admitting: Gastroenterology

## 2017-08-06 ENCOUNTER — Ambulatory Visit (INDEPENDENT_AMBULATORY_CARE_PROVIDER_SITE_OTHER): Payer: BLUE CROSS/BLUE SHIELD | Admitting: Family

## 2017-08-06 VITALS — HR 69 | Temp 98.4°F | Resp 18 | Ht 62.5 in | Wt 180.8 lb

## 2017-08-06 DIAGNOSIS — R0989 Other specified symptoms and signs involving the circulatory and respiratory systems: Secondary | ICD-10-CM

## 2017-08-06 DIAGNOSIS — Z0000 Encounter for general adult medical examination without abnormal findings: Secondary | ICD-10-CM | POA: Diagnosis not present

## 2017-08-06 NOTE — Progress Notes (Signed)
Subjective:    Patient ID: Anne Huffman, female    DOB: 1965/09/20, 52 y.o.   MRN: [id number]  HPI  Patient presents today for complete physical.  Immunizations:  Diet: oatmeal for breakfast, adds butter/sugar/cinnamon or bacon/egg/cheese (sometimes hotcakes from [employer])  Lunch:  [employer] (fried okra, corn, cabbage, turnip greens) or burger/fries  Dinner:  sometimes she cooks but eats out ([employer]- fried shrimp/french fries)   Drinks sodas/water/milk Exercise: not exercising Colonoscopy: due  Pap Smear: 3/18  Mammogram: 3/18 Vision: 2017 Dental: due  Wt Readings from Last 3 Encounters:  08/06/17 180 lb 12.8 oz (82 kg)  03/10/17 177 lb (80.3 kg)  03/26/16 169 lb 9.6 oz (76.9 kg)    Review of Systems  Constitutional: Positive for unexpected weight change.  HENT: Negative for hearing loss and rhinorrhea.   Eyes: Negative for visual disturbance.  Respiratory: Negative for cough.   Cardiovascular: Negative for leg swelling.  Gastrointestinal: Negative for diarrhea, nausea and vomiting.  Genitourinary: Negative for dysuria, frequency and hematuria.  Musculoskeletal: Negative for arthralgias and myalgias.  Skin: Negative for rash.  Neurological:       Occasional HA's  Hematological: Negative for adenopathy.  Psychiatric/Behavioral:       Denies depression       Past Medical History:  Diagnosis Date  . Allergy   . Asthma    since childhood  . Decreased visual acuity 04/21/2014  . Depression    no treatment  . Enlarged thyroid gland 03/26/2016  . Fatigue 08/20/2012  . Gastroenteritis 01/01/2015  . H. pylori infection 06/09/2013  . History of chicken pox    childhood  . Low back pain 03/26/2016  . Mild intermittent asthma 03/11/2011  . Preventative health care 09/21/2012  . Seasonal allergies   . Ulcer    stomach  . Vaginitis 03/26/2016     Social History   Socioeconomic History  . Marital status: Single    Spouse name: Not on file  . Number of children:  Not on file  . Years of education: Not on file  . Highest education level: Not on file  Occupational History  . Not on file  Social Needs  . Financial resource strain: Not on file  . Food insecurity:    Worry: Not on file    Inability: Not on file  . Transportation needs:    Medical: Not on file    Non-medical: Not on file  Tobacco Use  . Smoking status: Never Smoker  . Smokeless tobacco: Never Used  Substance and Sexual Activity  . Alcohol use: No  . Drug use: No  . 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 Physical activity:    Days per week: Not on file    Minutes per session: Not on file  . Stress: Not on file  Relationships  . Social connections:    Talks on phone: Not on file    Gets together: Not on file    Attends religious service: Not on file    Active member of club or organization: Not on file    Attends meetings of clubs or organizations: Not on file    Relationship status: Not on file  . Intimate partner violence:    Fear of current or ex partner: Not on file    Emotionally abused: Not on file    Physically abused: Not on file  Forced sexual activity: Not on file  Other Topics Concern  . Not on file  Social History Narrative   CNA    Past Surgical History:  Procedure Laterality Date  . NO PAST SURGERIES  03/11/2011    Family History  Problem Relation Age of Onset  . Breast cancer Maternal Grandmother   . Cancer Maternal Grandmother        breast  . Hypertension Mother        maternal aunt  . Eczema Mother   . Eczema Sister   . Eczema Sister   . Cancer Other        breast cancer  . Prostate cancer Neg Hx   . Colon cancer Neg Hx   . Heart disease Neg Hx   . Diabetes Neg Hx   . Depression Neg Hx   . Stroke Neg Hx     Allergies  Allergen Reactions  . Peanuts [Peanut Oil] Itching    No current outpatient medications on file prior to visit.   No  current facility-administered medications on file prior to visit.     Pulse 69   Temp 98.4 F (36.9 C) (Oral)   Resp 18   Ht 5' 2.5" (1.588 m)   Wt 180 lb 12.8 oz (82 kg)   SpO2 100%   BMI 32.54 kg/m    Objective:   Physical Exam   Physical Exam  Constitutional: She is oriented to person, place, and time. She appears well-developed and well-nourished. No distress.  HENT:  Head: Normocephalic and atraumatic.  Right Ear: Tympanic membrane and ear canal normal.  Left Ear: Tympanic membrane and ear canal normal.  Mouth/Throat: Oropharynx is clear and moist.  Eyes: Pupils are equal, round, and reactive to light. No scleral icterus.  Neck: Normal range of motion. No thyromegaly present.  Cardiovascular: Normal rate and regular rhythm.  Prominent right sided JVD noted No murmur heard. Pulmonary/Chest: Effort normal and breath sounds normal. No respiratory distress. He has no wheezes. She has no rales. She exhibits no tenderness.  Abdominal: Soft. Bowel sounds are normal. She exhibits no distension and no mass. There is no tenderness. There is no rebound and no guarding.  Musculoskeletal: She exhibits no edema.  Lymphadenopathy:    She has no cervical adenopathy.  Neurological: She is alert and oriented to person, place, and time. She has normal patellar reflexes. She exhibits normal muscle tone. Coordination normal.  Skin: Skin is warm and dry.  Psychiatric: She has a normal mood and affect. Her behavior is normal. Judgment and thought content normal.  Pelvic: deferred        Assessment & Plan:           Assessment & Plan:  Preventative care- EKG tracing is personally reviewed.  EKG notes NSR.  No acute changes. Discussed diet/exercise/weight loss.  Immunizations reviewed. Tetanus and pap up to date. Refer for colonoscopy.   Obtain routine lab work.   JVD- new finding. Will obtain 2D echo to further evaluate.

## 2017-08-06 NOTE — Patient Instructions (Addendum)
Please schedule routine eye exam and dental.  Continue to work on healthy diet, exercise and weight loss.  You will be contacted about scheduling your echocardiogram. Schedule mammogram on the first floor.

## 2017-08-14 ENCOUNTER — Other Ambulatory Visit: Payer: BLUE CROSS/BLUE SHIELD

## 2017-08-14 ENCOUNTER — Ambulatory Visit: Payer: BLUE CROSS/BLUE SHIELD | Admitting: Medical

## 2017-08-14 ENCOUNTER — Encounter: Payer: Self-pay | Admitting: Medical

## 2017-08-14 ENCOUNTER — Ambulatory Visit (HOSPITAL_BASED_OUTPATIENT_CLINIC_OR_DEPARTMENT_OTHER)
Admission: RE | Admit: 2017-08-14 | Discharge: 2017-08-14 | Disposition: A | Discharge status: Home / Self Care | Payer: BLUE CROSS/BLUE SHIELD | Source: Ambulatory Visit | Attending: Family | Admitting: Family

## 2017-08-14 VITALS — BP 142/78 | HR 72 | Temp 98.4°F | Resp 16 | Ht 62.5 in | Wt 182.0 lb

## 2017-08-14 DIAGNOSIS — K0510 Chronic gingivitis, plaque induced: Secondary | ICD-10-CM | POA: Diagnosis not present

## 2017-08-14 DIAGNOSIS — Z0000 Encounter for general adult medical examination without abnormal findings: Secondary | ICD-10-CM | POA: Insufficient documentation

## 2017-08-14 DIAGNOSIS — R5383 Other fatigue: Secondary | ICD-10-CM

## 2017-08-14 DIAGNOSIS — K140 Glossitis: Secondary | ICD-10-CM

## 2017-08-14 DIAGNOSIS — B370 Candidal stomatitis: Secondary | ICD-10-CM

## 2017-08-14 LAB — URINALYSIS, ROUTINE W REFLEX MICROSCOPIC
Bilirubin Urine: NEGATIVE
Ketones, ur: NEGATIVE
Leukocytes, UA: NEGATIVE
Nitrite: NEGATIVE
RBC / HPF: NONE SEEN (ref 0–?)
Specific Gravity, Urine: >=1.03 — AB (ref 1.000–1.030)
Total Protein, Urine: NEGATIVE
Urine Glucose: NEGATIVE
Urobilinogen, UA: 0.2 (ref 0.0–1.0)
pH: 5 (ref 5.0–8.0)

## 2017-08-14 LAB — BASIC METABOLIC PANEL
BUN: 10 mg/dL (ref 6–23)
CO2: 29 mEq/L (ref 19–32)
Calcium: 9.4 mg/dL (ref 8.4–10.5)
Chloride: 105 mEq/L (ref 96–112)
Creatinine, Ser: 0.67 mg/dL (ref 0.40–1.20)
GFR: 119.37 mL/min (ref >60.00)
Glucose, Bld: 99 mg/dL (ref 70–99)
Potassium: 4.1 mEq/L (ref 3.5–5.1)
Sodium: 140 mEq/L (ref 135–145)

## 2017-08-14 LAB — CBC WITH DIFFERENTIAL/PLATELET
Basophils Absolute: 0 10*3/uL (ref 0.0–0.1)
Basophils Relative: 1 % (ref 0.0–3.0)
Eosinophils Absolute: 0.2 10*3/uL (ref 0.0–0.7)
Eosinophils Relative: 3.4 % (ref 0.0–5.0)
HCT: 41.1 % (ref 36.0–46.0)
Hemoglobin: 13.6 g/dL (ref 12.0–15.0)
Lymphocytes Relative: 30 % (ref 12.0–46.0)
Lymphs Abs: 1.5 10*3/uL (ref 0.7–4.0)
MCHC: 33.1 g/dL (ref 30.0–36.0)
MCV: 89.4 fl (ref 78.0–100.0)
Monocytes Absolute: 0.3 10*3/uL (ref 0.1–1.0)
Monocytes Relative: 6.8 % (ref 3.0–12.0)
Neutro Abs: 3 10*3/uL (ref 1.4–7.7)
Neutrophils Relative %: 58.8 % (ref 43.0–77.0)
Platelets: 306 10*3/uL (ref 150.0–400.0)
RBC: 4.6 Mil/uL (ref 3.87–5.11)
RDW: 13.2 % (ref 11.5–15.5)
WBC: 5.1 10*3/uL (ref 4.0–10.5)

## 2017-08-14 LAB — LIPID PANEL
Cholesterol: 189 mg/dL (ref 0–200)
HDL: 61.3 mg/dL (ref >39.00)
LDL Cholesterol: 115 mg/dL — ABNORMAL HIGH (ref 0–99)
NonHDL: 127.73
Total CHOL/HDL Ratio: 3
Triglycerides: 66 mg/dL (ref 0.0–149.0)
VLDL: 13.2 mg/dL (ref 0.0–40.0)

## 2017-08-14 LAB — HEPATIC FUNCTION PANEL
ALT: 11 U/L (ref 0–35)
AST: 8 U/L (ref 0–37)
Albumin: 3.9 g/dL (ref 3.5–5.2)
Alkaline Phosphatase: 100 U/L (ref 39–117)
Bilirubin, Direct: 0.1 mg/dL (ref 0.0–0.3)
Total Bilirubin: 0.8 mg/dL (ref 0.2–1.2)
Total Protein: 7.1 g/dL (ref 6.0–8.3)

## 2017-08-14 LAB — TSH: TSH: 0.67 u[IU]/mL (ref 0.35–4.50)

## 2017-08-14 LAB — VITAMIN B12: Vitamin B-12: 425 pg/mL (ref 211–911)

## 2017-08-14 MED ORDER — FLUCONAZOLE 150 MG PO TABS: 150.0000 mg | ORAL_TABLET | Freq: Once | ORAL | 0 refills | Status: AC

## 2017-08-14 NOTE — Patient Instructions (Signed)
By your recent description and physical exam, I do think it would be a good idea to give you Diflucan 1tablet to use for 1day for possible thrush.  Your gingival looks slightly swollen recently.  We will see how you respond to Diflucan but also recommend that you floss daily.  For the next couple of days recommend warm salt water mouth rinses twice daily.  For mild glossitis we will get B12 and B1 level.  Also I want you to go ahead and get the labs that you had placed the other day when you did your wellness exam.  Point out the lab personnel that you are now fasting today.  They should be able to pull those labs.  Follow-up in 5 to 7 days or as needed.(I think call to update [place] or my chart message would be fine provided everything stable.)

## 2017-08-14 NOTE — Progress Notes (Signed)
Subjective:    Patient ID: Anne Huffman, female    DOB: 17-Mar-1965, 52 y.o.   MRN: [id number]  HPI  Pt in reporting some slight mouth irritation. She thought her gums looked little inflamed. After brushing teeth her mouth feels tingly. Pt mentioned this has been going on for 2 days. Also stated 2 days ago if rubbed tongue against teeth felt little senstive. Slight faint white appearance to tongue per pt but not thick film. No recent antbiotics used.  Pt does floss teeth but not every day.  Pt states above symptoms still present but mild improved   .Review of Systems  Constitutional: Positive for fatigue. Negative for chills and fever.       Mild fatigue.  HENT:       See hpi.  Respiratory: Negative for cough, chest tightness, shortness of breath and wheezing.   Cardiovascular: Negative for chest pain and palpitations.  Gastrointestinal: Negative for abdominal pain.  Hematological: Negative for adenopathy. Does not bruise/bleed easily.  Psychiatric/Behavioral: Negative for behavioral problems and confusion.   Past Medical History:  Diagnosis Date  . Allergy   . Asthma    since childhood  . Decreased visual acuity 04/21/2014  . Depression    no treatment  . Enlarged thyroid gland 03/26/2016  . Fatigue 08/20/2012  . Gastroenteritis 01/01/2015  . H. pylori infection 06/09/2013  . History of chicken pox    childhood  . Low back pain 03/26/2016  . Mild intermittent asthma 03/11/2011  . Preventative health care 09/21/2012  . Seasonal allergies   . Ulcer    stomach  . Vaginitis 03/26/2016     Social History   Socioeconomic History  . Marital status: Single    Spouse name: Not on file  . Number of children: Not on file  . Years of education: Not on file  . Highest education level: Not on file  Occupational History  . Not on file  Social Needs  . Financial resource strain: Not on file  . Food insecurity:    Worry: Not on file    Inability: Not on file  . Transportation  needs:    Medical: Not on file    Non-medical: Not on file  Tobacco Use  . Smoking status: Never Smoker  . Smokeless tobacco: Never Used  Substance and Sexual Activity  . Alcohol use: No  . Drug use: No  . 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 Physical activity:    Days per week: Not on file    Minutes per session: Not on file  . Stress: Not on file  Relationships  . Social connections:    Talks on phone: Not on file    Gets together: Not on file    Attends religious service: Not on file    Active member of club or organization: Not on file    Attends meetings of clubs or organizations: Not on file    Relationship status: Not on file  . Intimate partner violence:    Fear of current or ex partner: Not on file    Emotionally abused: Not on file    Physically abused: Not on file    Forced sexual activity: Not on file  Other Topics Concern  . 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 NO PAST SURGERIES  03/11/2011    Family History  Problem Relation Age of Onset  . Breast cancer Maternal Grandmother   . Cancer Maternal Grandmother        breast  . Hypertension Mother        maternal aunt  . Eczema Mother   . Eczema Sister   . Eczema Sister   . Cancer Other        breast cancer  . Prostate cancer Neg Hx   . Colon cancer Neg Hx   . Heart disease Neg Hx   . Diabetes Neg Hx   . Depression Neg Hx   . Stroke Neg Hx     Allergies  Allergen Reactions  . Peanuts [Peanut Oil] Itching    No current outpatient medications on file prior to visit.   No current facility-administered medications on file prior to visit.     BP (!) 142/78   Pulse 72   Temp 98.4 F (36.9 C) (Oral)   Resp 16   Ht 5' 2.5" (1.588 m)   Wt 182 lb (82.6 kg)   SpO2 100%   BMI 32.76 kg/m       Objective:   Physical  Exam  General  Mental Status - Alert. General Appearance - Well groomed. Not in acute distress.  Skin Rashes- No Rashes.  HEENT Head- Normal. Ear Auditory Canal - Left- Normal. Right - Normal.Tympanic Membrane- Left- Normal. Right- Normal. Eye Sclera/Conjunctiva- Left- Normal. Right- Normal. Nose & Sinuses Nasal Mucosa- Left-  Not  Boggy and Congested. Right-  Not  Boggy and  Congested.Bilateral  No maxillary and  No frontal sinus pressure. Mouth & Throat Lips: Upper Lip- Normal: no dryness, cracking, pallor, cyanosis, or vesicular eruption. Lower Lip-Normal: no dryness, cracking, pallor, cyanosis or vesicular eruption. Buccal Mucosa- Bilateral- No Aphthous ulcers. Oropharynx- No Discharge or Erythema. Tonsils: Characteristics- Bilateral- No Erythema or Congestion. Size/Enlargement- Bilateral- No enlargement. Discharge- bilateral-None.  Mouth- slight faint discoloration to tongue faint whitish film.  No ulcerations or lesions seen.  Her gums look slightly inflamed.  But not tender to palpation.  buccal mucosa clear from any lesions or ulcerations.  Neck Neck- Supple. No Masses.   Chest and Lung Exam Auscultation: Breath Sounds:-Clear even and unlabored.  Cardiovascular Auscultation:Rythm- Regular, rate and rhythm. Murmurs & Other Heart Sounds:Ausculatation of the heart reveal- No Murmurs.  Lymphatic Head & Neck General Head & Neck Lymphatics: Bilateral: Description- No Localized lymphadenopathy.       Assessment & Plan:  By your recent description and physical exam, I do think it would be a good idea to give you Diflucan 1tablet to use for 1day for possible thrush.  Your gingival looks slightly swollen recently.  We will see how you respond to Diflucan but also recommend that you floss daily.  For the next couple of days recommend warm salt water mouth rinses twice daily.  For mild glossitis we will get B12 and B1 level.  Also I want you to go ahead and get the labs  that you had placed the other day when you did your wellness exam.  Point out the lab personnel that you are now fasting today.  They should be able to pull those labs.  Follow-up in 5 to 7 days or as needed.(I think call to update [place] or my chart message would be fine provided everything stable.)  Mackie Pai, PA-C

## 2017-08-17 ENCOUNTER — Encounter: Payer: Self-pay | Admitting: Family

## 2017-08-19 ENCOUNTER — Encounter: Payer: Self-pay | Admitting: Family Medicine

## 2017-08-19 LAB — VITAMIN B1: Vitamin B1 (Thiamine): 10 nmol/L (ref 8–30)

## 2017-08-20 ENCOUNTER — Encounter: Payer: Self-pay | Admitting: Medical

## 2017-08-20 ENCOUNTER — Ambulatory Visit (INDEPENDENT_AMBULATORY_CARE_PROVIDER_SITE_OTHER): Payer: BLUE CROSS/BLUE SHIELD | Admitting: Medical

## 2017-08-20 ENCOUNTER — Ambulatory Visit (HOSPITAL_BASED_OUTPATIENT_CLINIC_OR_DEPARTMENT_OTHER): Payer: BLUE CROSS/BLUE SHIELD

## 2017-08-20 VITALS — BP 149/80 | HR 70 | Temp 98.9°F | Resp 16 | Ht 62.5 in | Wt 181.2 lb

## 2017-08-20 DIAGNOSIS — B370 Candidal stomatitis: Secondary | ICD-10-CM | POA: Diagnosis not present

## 2017-08-20 DIAGNOSIS — J029 Acute pharyngitis, unspecified: Secondary | ICD-10-CM | POA: Diagnosis not present

## 2017-08-20 LAB — POCT RAPID STREP A (OFFICE): Rapid Strep A Screen: NEGATIVE

## 2017-08-20 MED ORDER — FLUCONAZOLE 150 MG PO TABS: 150.0000 mg | ORAL_TABLET | Freq: Once | ORAL | 0 refills | Status: AC

## 2017-08-20 MED ORDER — NYSTATIN 100000 UNIT/ML MT SUSP: 5.0000 mL | Freq: Four times a day (QID) | OROMUCOSAL | 0 refills | Status: DC

## 2017-08-20 NOTE — Patient Instructions (Addendum)
You had some improvement of tongue discomfort with 1 tablet of Diflucan.  But then symptoms reoccurred.  Your B12 and B1 level were both normal.  I do want you to take another Diflucan tablet followed by a short course of nystatin suspension.  If your symptoms persist despite this treatment then would refer you to ENT.  You did report some mild scratchy throat sensation times so decided to go ahead and do a rapid strep test which was negative and we will do a send out throat culture.  We will let you know those results.  Follow-up in 7 to 10 days or as needed.

## 2017-08-20 NOTE — Progress Notes (Signed)
Subjective:    Patient ID: Anne Huffman, female    DOB: [birth year], 52 y.o.   MRN: [id number]  HPI  Pt in for follow up.  She states her mouth and tongue overall feels better today. But not completely better.  She states 2 days ago she had grainy appearance after drinking mountain dew and she ate some special-k cereal and it irritated her palate. Also tongue was sensitive that day.  On last visit she had normal b12 and b1 vitamin.   Today she reports that very back of her throat hurts a little bit and is feels scratchy. Not constant. Denies itching. No lip swelling or sob.  Pt does note that after one tablet of diflucan seemed to help a lot over next 24 hours. But then symptoms reoccurred.    Review of Systems  Constitutional: Negative for chills, fatigue and fever.  HENT: Negative for sore throat.        Patient has irritated tongue with eating recently.  Please see HPI.  Also some mild throat irritation/scratchy sensation.  Respiratory: Negative for cough, chest tightness, shortness of breath and wheezing.   Cardiovascular: Negative for chest pain and palpitations.  Gastrointestinal: Negative for abdominal pain, blood in stool and nausea.  Genitourinary: Negative for dyspareunia and dysuria.  Musculoskeletal: Negative for back pain, myalgias and neck pain.  Skin: Negative for rash.  Neurological: Negative for dizziness, weakness, numbness and headaches.  Hematological: Negative for adenopathy. Does not bruise/bleed easily.  Psychiatric/Behavioral: Negative for agitation, behavioral problems, decreased concentration, self-injury, sleep disturbance and suicidal ideas. The patient is not nervous/anxious.     Past Medical History:  Diagnosis Date  . Allergy   . Asthma    since childhood  . Decreased visual acuity 04/21/2014  . Depression    no treatment  . Enlarged thyroid gland 03/26/2016  . Fatigue 08/20/2012  . Gastroenteritis 01/01/2015  . H. pylori infection 06/09/2013   . History of chicken pox    childhood  . Low back pain 03/26/2016  . Mild intermittent asthma 03/11/2011  . Preventative health care 09/21/2012  . Seasonal allergies   . Ulcer    stomach  . Vaginitis 03/26/2016     Social History   Socioeconomic History  . Marital status: Single    Spouse name: Not on file  . Number of children: Not on file  . Years of education: Not on file  . Highest education level: Not on file  Occupational History  . Not on file  Social Needs  . Financial resource strain: Not on file  . Food insecurity:    Worry: Not on file    Inability: Not on file  . Transportation needs:    Medical: Not on file    Non-medical: Not on file  Tobacco Use  . Smoking status: Never Smoker  . Smokeless tobacco: Never Used  Substance and Sexual Activity  . Alcohol use: No  . Drug use: No  . 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 Physical activity:    Days per week: Not on file    Minutes per session: Not on file  . Stress: Not on file  Relationships  . Social connections:    Talks on phone: Not on file    Gets together: Not on file    Attends religious service: Not on file    Active member of club or  organization: Not on file    Attends meetings of clubs or organizations: Not on file    Relationship status: Not on file  . Intimate partner violence:    Fear of current or ex partner: Not on file    Emotionally abused: Not on file    Physically abused: Not on file    Forced sexual activity: Not on file  Other Topics Concern  . 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 NO PAST SURGERIES  03/11/2011    Family History  Problem Relation Age of Onset  . Breast cancer Maternal Grandmother   . Cancer Maternal Grandmother        breast  . Hypertension Mother        maternal aunt  . Eczema  Mother   . Eczema Sister   . Eczema Sister   . Cancer Other        breast cancer  . Prostate cancer Neg Hx   . Colon cancer Neg Hx   . Heart disease Neg Hx   . Diabetes Neg Hx   . Depression Neg Hx   . Stroke Neg Hx     Allergies  Allergen Reactions  . Peanuts [Peanut Oil] Itching    Current Outpatient Medications on File Prior to Visit  Medication Sig Dispense Refill  . fluconazole (DIFLUCAN) 150 MG tablet   0   No current facility-administered medications on file prior to visit.     BP (!) 149/80   Pulse 70   Temp 98.9 F (37.2 C) (Oral)   Resp 16   Ht 5' 2.5" (1.588 m)   Wt 181 lb 3.2 oz (82.2 kg)   LMP 10/27/2015   SpO2 100%   BMI 32.61 kg/m       Objective:   Physical Exam   General  Mental Status - Alert. General Appearance - Well groomed. Not in acute distress.  Skin Rashes- No Rashes.  HEENT Head- Normal. Ear Auditory Canal - Left- Normal. Right - Normal.Tympanic Membrane- Left- Normal. Right- Normal. Eye Sclera/Conjunctiva- Left- Normal. Right- Normal. Nose & Sinuses Nasal Mucosa- Left-  Boggy and Congested. Right-  Boggy and  Congested.Bilateral no  maxillary and  No frontal sinus pressure. Mouth & Throat Lips: Upper Lip- Normal: no dryness, cracking, pallor, cyanosis, or vesicular eruption. Lower Lip-Normal: no dryness, cracking, pallor, cyanosis or vesicular eruption. Buccal Mucosa- Bilateral- No Aphthous ulcers. Oropharynx- No Discharge or Erythema. Tonsils: Characteristics- Bilateral- faint Erythema or Congestion. Size/Enlargement- Bilateral- No enlargement. Discharge- bilateral-None. Tongue- mild red with faint white film. No lesions.  Neck Neck- Supple. No Masses.   Chest and Lung Exam Auscultation: Breath Sounds:-Clear even and unlabored.  Cardiovascular Auscultation:Rythm- Regular, rate and rhythm. Murmurs & Other Heart Sounds:Ausculatation of the heart reveal- No Murmurs.  Lymphatic Head & Neck General Head & Neck  Lymphatics: Bilateral: Description- No Localized lymphadenopathy.      Assessment & Plan:  You had some improvement of tongue discomfort with 1 tablet of Diflucan.  But then symptoms reoccurred.  Your B12 and B1 level were both normal.  I do want you to take another Diflucan tablet followed by a short course of nystatin suspension.  If your symptoms persist despite this treatment then would refer you to ENT.  You did report some mild scratchy throat sensation times so decided to go ahead and do a rapid strep test which  was negative and we will do a send out throat culture.  We will let you know those results.  Follow-up in 7 to 10 days or as needed.  Mackie Pai, PA-C

## 2017-08-22 LAB — CULTURE, GROUP A STREP
MICRO NUMBER:: 90905379
SPECIMEN QUALITY:: ADEQUATE

## 2017-08-25 ENCOUNTER — Telehealth: Payer: Self-pay

## 2017-08-25 ENCOUNTER — Ambulatory Visit (AMBULATORY_SURGERY_CENTER): Payer: Self-pay

## 2017-08-25 VITALS — Ht 63.0 in | Wt 182.0 lb

## 2017-08-25 DIAGNOSIS — Z1211 Encounter for screening for malignant neoplasm of colon: Secondary | ICD-10-CM

## 2017-08-25 MED ORDER — SOD PICOSULFATE-MAG OX-CIT ACD 10-3.5-12 MG-GM -GM/160ML PO SOLN: 1.0000 | Freq: Once | ORAL | 0 refills | Status: AC

## 2017-08-25 NOTE — Telephone Encounter (Signed)
Dr. Lyndel Safe,   Anne Huffman is a direct screening colonoscopy that is scheduled for 09/12/17 @ 3:00 Pm. 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 Desirea said that she could not afford the bill so she cancelled. The patient states that she has not had any chest pain, dizziness,  Blacking out or any other symptoms. Is it ok to proceed with the colonoscopy without the echo, or should wait until she can have the echo? Her new insurance will be updated in the Nov. 2019. Please advise! Thanks!   Riki Sheer, LPN  ( [facility] )

## 2017-08-25 NOTE — Progress Notes (Signed)
Denies allergies to eggs or soy products. Denies complication of anesthesia or sedation. Denies use of weight loss medication. Denies use of O2.   Emmi instructions declined.   Patient was scheduled for an echo but cancelled the procedure because insurance would not cover the cost. It was going to be 3,000 out of pocket.

## 2017-09-02 ENCOUNTER — Ambulatory Visit: Payer: Self-pay | Admitting: Family Medicine

## 2017-09-02 NOTE — Telephone Encounter (Signed)
I tried to call the patient to inform her that she should have the echo cardiogram prior to having the screening colonoscopy. Patient was scheduled but her insurance won't pay and it was going to cost 3,000 dollars. Patients insurance will be renewed in the fall and she may be able to get it done after she renews. Left patient a message that we will call her back.  Colonoscopy should be cancelled until after she had cardiac clearance.   Riki Sheer, LPN

## 2017-09-02 NOTE — Telephone Encounter (Signed)
I returned her call.    She has been vomiting and having diarrhea since this morning about 2:30am.   Not vomiting now but still nauseas.   Still having diarrhea.   She c/o having chills this morning but not now.   "I do feel warm".   "I don't have a thermometer".   "I just woke up a little while ago from resting and my lower back, lower abd and groin areas are aching".   "I have a mild headache".   When asked if she was drinking any fluids she replied, "No".   "My daughter is going to bring me some Ginger Ale.    I instructed her to drink water.   She agreed to this.   I also suggested her daughter bring some Gator Aid when she brings the Ginger Ale.    I also suggested she have her daughter bring her some Pepto Bismal too.     See triage notes.    I instructed her to call us back if she was not better tomorrow or still not able to keep down any fluids or if she started feeling worse or having worsening abd pain.   I let her know if it was after hours to go to an urgent care or ED.   She verbalized if she was not better by tomorrow or improving she would call us back.    I routed a note to Dr. Frederik Pear office so she would be aware too. Reason for Disposition . MILD vomiting with diarrhea  Answer Assessment - Initial Assessment Questions 1. VOMITING SEVERITY: "How many times have you vomited in the past 24 hours?"     - MILD:  1 - 2 times/day    - MODERATE: 3 - 5 times/day, decreased oral intake without significant weight loss or symptoms of dehydration    - SEVERE: 6 or more times/day, vomits everything or nearly everything, with significant weight loss, symptoms of dehydration      This morning I vomited 3 times.   I'm still feeling nauseas.   I'm having some diarrhea.    My back, lower abd and groin area are aching that started when  I woke up a little while ago. 2. ONSET: "When did the vomiting begin?"      This morning about 2:30am.    3. FLUIDS: "What fluids or food have you vomited up  today?" "Have you been able to keep any fluids down?"     I drank a bottle of water and I started vomiting. 4. ABDOMINAL PAIN: "Are your having any abdominal pain?" If yes : "How bad is it and what does it feel like?" (e.g., crampy, dull, intermittent, constant)      Hurting around lower part of my abd, back and on top of my vigina area  on the front of my abd.    I'm having chills this morning.    5. DIARRHEA: "Is there any diarrhea?" If so, ask: "How many times today?"      About 5 times today. 6. CONTACTS: "Is there anyone else in the family with the same symptoms?"      Yes.   We were together Sunday for my mother's birthday.   Some of the family members had been sick with diarrhea but none were sick on Sunday. 7. CAUSE: "What do you think is causing your vomiting?"     Maybe a stomach bug. 8. HYDRATION STATUS: "Any signs of dehydration?" (e.g., dry mouth [not  only dry lips], too weak to stand) "When did you last urinate?"     I've not eaten or drank anything.   I'm getting my daughter to bring me some Ginger Al and some Gator Aid was suggested too. 9. OTHER SYMPTOMS: "Do you have any other symptoms?" (e.g., fever, headache, vertigo, vomiting blood or coffee grounds, recent head injury)     I have a headache, the vomit looked like a mocha drink.   It was grainy and flaky.   No accidents. 10. PREGNANCY: "Is there any chance you are pregnant?" "When was your last menstrual period?"       No.  Protocols used: [facility]

## 2017-09-02 NOTE — Telephone Encounter (Signed)
Raised JVD means that she is in CHF Good idea to get the echo to be on the safer side. Please make sure that the echo has not been done within the last year-anywhere. She does not have any symptoms which is really a good sign. At the least, she should see a primary care physician for physical before.  Routine physical would not cost her as it should be covered.

## 2017-09-03 ENCOUNTER — Encounter: Payer: Self-pay | Admitting: Family

## 2017-09-03 ENCOUNTER — Telehealth: Payer: Self-pay | Admitting: Family Medicine

## 2017-09-03 ENCOUNTER — Ambulatory Visit: Payer: BLUE CROSS/BLUE SHIELD | Admitting: Family

## 2017-09-03 ENCOUNTER — Ambulatory Visit: Payer: Self-pay | Admitting: Family Medicine

## 2017-09-03 VITALS — BP 140/84 | HR 75 | Temp 98.9°F | Ht 63.0 in | Wt 175.8 lb

## 2017-09-03 DIAGNOSIS — K625 Hemorrhage of anus and rectum: Secondary | ICD-10-CM

## 2017-09-03 DIAGNOSIS — K529 Noninfective gastroenteritis and colitis, unspecified: Secondary | ICD-10-CM | POA: Diagnosis not present

## 2017-09-03 NOTE — Telephone Encounter (Signed)
I returned her call.   I triaged her yesterday for vomiting and diarrhea.    Today she is c/o having no vomiting but diarrhea has gotten much worse.   She was incontinent of stool last night.   "I messed up my bed and it was running down my legs it was so bad".   She also mentioned seeing blood on the toilet paper when she wiped about midnight last night.   Not seen it this morning.    She has taken Pepto Bismal 2-3 times since yesterday which has not helped.    She is drinking fluids per pt.   "I'm drinking a lot of Ginger Al and Gator Aid".   I encouraged her to continue drinking plenty of fluids.    Dr. Charlett Blake is not available today so I scheduled her with Debbrah Alar, FNP for today at 11:40.    "I've See her before".        Reason for Disposition . [1] Blood in the stool AND [2] small amount of blood   (Exception: only on toilet paper. Reason: diarrhea can cause rectal irritation with blood on wiping)  Answer Assessment - Initial Assessment Questions 1. DIARRHEA SEVERITY: "How bad is the diarrhea?" "How many extra stools have you had in the past 24 hours than normal?"    - NO DIARRHEA (SCALE 0)   - MILD (SCALE 1-3): Few loose or mushy BMs; increase of 1-3 stools over normal daily number of stools; mild increase in ostomy output.   -  MODERATE (SCALE 4-7): Increase of 4-6 stools daily over normal; moderate increase in ostomy output. * SEVERE (SCALE 8-10; OR 'WORST POSSIBLE'): Increase of 7 or more stools daily over normal; moderate increase in ostomy output; incontinence.     Still having diarrhea.   It's running out.   It's running down my leg and I messed up my bed last night. 2. ONSET: "When did the diarrhea begin?"      2:30am yesterday morning. 3. BM CONSISTENCY: "How loose or watery is the diarrhea?"      Watery 4. VOMITING: "Are you also vomiting?" If so, ask: "How many times in the past 24 hours?"      No 5. ABDOMINAL PAIN: "Are you having any abdominal pain?" If yes: "What  does it feel like?" (e.g., crampy, dull, intermittent, constant)      No pain 6. ABDOMINAL PAIN SEVERITY: If present, ask: "How bad is the pain?"  (e.g., Scale 1-10; mild, moderate, or severe)   - MILD (1-3): doesn't interfere with normal activities, abdomen soft and not tender to touch    - MODERATE (4-7): interferes with normal activities or awakens from sleep, tender to touch    - SEVERE (8-10): excruciating pain, doubled over, unable to do any normal activities       None 7. ORAL INTAKE: If vomiting, "Have you been able to drink liquids?" "How much fluids have you had in the past 24 hours?"     I've been drinking  Ginger Al and Gator Aid.    Ate 1/2 bowel of chicken noodle soup. 8. HYDRATION: "Any signs of dehydration?" (e.g., dry mouth [not just dry lips], too weak to stand, dizziness, new weight loss) "When did you last urinate?"     Earlier I felt light headed this morning 9. EXPOSURE: "Have you traveled to a foreign country recently?" "Have you been exposed to anyone with diarrhea?" "Could you have eaten any food that was spoiled?"  No 10. ANTIBIOTIC USE: "Are you taking antibiotics now or have you taken antibiotics in the past 2 months?"       No  11. OTHER SYMPTOMS: "Do you have any other symptoms?" (e.g., fever, blood in stool)       Blood in stool.  I saw it when I wiped.   Last time I saw it was midnight last night.  I thought it was from the [facility].    12. PREGNANCY: "Is there any chance you are pregnant?" "When was your last menstrual period?"       No  Protocols used: DIARRHEA-A-AH

## 2017-09-03 NOTE — Patient Instructions (Signed)
You may use imodium as needed for diarrhea. Stay well hydrated. Go to ER if you have more than 1 stool with blood in it, if you develop weakness, or if you develop abdominal pain. You should be contacted about your referral to GI. Please call if diarrhea is not improved in 3 days.

## 2017-09-03 NOTE — Telephone Encounter (Signed)
Spoke with the patient. I gave her Dr.Gupta's recommendations. Pt is going to call her PCP to see if she can get clearance for the screening colonoscopy or to see if any other test can be done instead of the echo which will cost her $3,000. Pt aware that we do need clearance before the colonoscopy on 09/12/17. Pt is to call us back with this clearance from the PCP or the colon will be cancelled.

## 2017-09-03 NOTE — Telephone Encounter (Signed)
Copied from [place] [id number]. Topic: Quick Communication - See Telephone Encounter >> Sep 03, 2017  8:29 AM [name], NT wrote: CRM for notification. See Telephone encounter for: 09/03/17. Patient called and states that she continues to have Diarrhea she is not having anymore nausea or vomiting . She is wondering if Dr. Charlett Blake can call her in something for the diarrhea . Please advise.  [facility] [place], [place] [phone] (Phone) [phone] (Fax)

## 2017-09-03 NOTE — Progress Notes (Signed)
Subjective:    Patient ID: Anne Huffman, female    DOB: [birth year], 52 y.o.   MRN: [id number]  HPI  [name] is a 52 yr old female who presents today with chief complaint of blood on toilet tissue last night after BM.  Reports that at 2:30 AM on Tuesday 8/13 she woke up with diarrhea/vomitting/chills.  Diarrhea continued and was severe with fecal incontinence.  Reports that she took pepto bismol at 6:52 PM and again at 7:22 PM.  Reports that she had some blood mixed in the stool.  Slight on the tissue.  This occurred yesterday evening.  Today she has moved her bowels about 5 times today.  Still watery today. She is drinking fluids.  Had some soup last night but appetite has not returned.    Review of Systems    see HPI  Past Medical History:  Diagnosis Date  . Allergy   . Asthma    since childhood  . Decreased visual acuity 04/21/2014  . Depression    no treatment  . Enlarged thyroid gland 03/26/2016  . Fatigue 08/20/2012  . Gastroenteritis 01/01/2015  . H. pylori infection 06/09/2013  . History of chicken pox    childhood  . Low back pain 03/26/2016  . Mild intermittent asthma 03/11/2011  . Preventative health care 09/21/2012  . Seasonal allergies   . Sickle cell trait ([place])   . Ulcer    stomach  . Vaginitis 03/26/2016     Social History   Socioeconomic History  . Marital status: Single    Spouse name: Not on file  . Number of children: Not on file  . Years of education: Not on file  . Highest education level: Not on file  Occupational History  . Not on file  Social Needs  . Financial resource strain: Not on file  . Food insecurity:    Worry: Not on file    Inability: Not on file  . Transportation needs:    Medical: Not on file    Non-medical: Not on file  Tobacco Use  . Smoking status: Never Smoker  . Smokeless tobacco: Never Used  Substance and Sexual Activity  . Alcohol use: No  . Drug use: No  . 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 Physical activity:    Days per week: Not on file    Minutes per session: Not on file  . Stress: Not on file  Relationships  . Social connections:    Talks on phone: Not on file    Gets together: Not on file    Attends religious service: Not on file    Active member of club or organization: Not on file    Attends meetings of clubs or organizations: Not on file    Relationship status: Not on file  . Intimate partner violence:    Fear of current or ex partner: Not on file    Emotionally abused: Not on file    Physically abused: Not on file    Forced sexual activity: Not on file  Other Topics Concern  . 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 NO PAST SURGERIES  03/11/2011    Family History  Problem Relation Age of Onset  . Breast cancer Maternal Grandmother   .  Cancer Maternal Grandmother        breast  . Hypertension Mother        maternal aunt  . Eczema Mother   . Eczema Sister   . Eczema Sister   . Cancer Other        breast cancer  . Prostate cancer Neg Hx   . Colon cancer Neg Hx   . Heart disease Neg Hx   . Diabetes Neg Hx   . Depression Neg Hx   . Stroke Neg Hx   . Esophageal cancer Neg Hx   . Rectal cancer Neg Hx   . Stomach cancer Neg Hx     Allergies  Allergen Reactions  . Peanuts [Peanut Oil] Itching    Current Outpatient Medications on File Prior to Visit  Medication Sig Dispense Refill  . nystatin (MYCOSTATIN) 100000 UNIT/ML suspension Take 5 mLs (500,000 Units total) by mouth 4 (four) times daily. 60 mL 0   No current facility-administered medications on file prior to visit.     BP 140/84 (BP Location: Right Arm, Patient Position: Sitting, Cuff Size: Normal)   Pulse 75   Temp 98.9 F (37.2 C) (Oral)   Ht 5\' 3"  (1.6 m)   Wt 175 lb 12.8 oz (79.7 kg)   LMP 10/27/2015   SpO2 99%   BMI 31.14  kg/m    Objective:   Physical Exam  Constitutional: She appears well-developed and well-nourished.  Cardiovascular: Normal rate, regular rhythm and normal heart sounds.  No murmur heard. Pulmonary/Chest: Effort normal and breath sounds normal. No respiratory distress. She has no wheezes.  Genitourinary: Rectal exam shows guaiac negative stool.  Genitourinary Comments: Normal rectal exam.   Psychiatric: She has a normal mood and affect. Her behavior is normal. Judgment and thought content normal.          Assessment & Plan:  Diarrhea likely secondary to gastroenteritis with blood in stool x 1-  Heme negative today with normal rectal exam.  Will refer to GI for further evaluation.  Check cbc,supportive measures/ return precautions discussed as outlined in AVS.

## 2017-09-12 ENCOUNTER — Encounter: Payer: BLUE CROSS/BLUE SHIELD | Admitting: Gastroenterology

## 2017-09-15 NOTE — Telephone Encounter (Signed)
I spoke with the patient and she is actually pursuing the echo and wants to complete that prior to scheduling the colonoscopy.  She will contact our office when she is ready to schedule.

## 2017-09-15 NOTE — Telephone Encounter (Signed)
-----   Message from Jackquline Denmark, MD sent at 09/13/2017 11:49 AM EDT ----- Jeannene Patella Looks like she needs screening colon Can you ask [facility] scheduling nurse to set it up Neldon Labella ----- Message ----- From: Debbrah Alar, NP Sent: 09/03/2017   7:10 PM EDT To: Jackquline Denmark, MD  Hi Dr. Lyndel Safe,  I referred her for a routine colo. She was also scheduled for echo to evaluate JVD.  Otherwise appears compensated from a cardiac standpoint.  Colo is covered by her insurance as screening, but echo is not and she cannot afford echo. Your office told her that she could not complete 09/12/17 colo unless echo is completed or she is cleared from a cardiac standpoint.  Today she presented with gastroenteritis/episode of BRBPR.  I have placed a referral for formal consult with you.  Tough situation from an insurance standpoint but I just wanted to let you know. Thanks for your help as always!  Melissa

## 2017-09-25 NOTE — Telephone Encounter (Signed)
Linda Can you please FU on ECHO Then colonoscopy (if she meets [facility] citerion) Pl make sure she does not fall thru the cracks

## 2017-09-25 NOTE — Telephone Encounter (Signed)
Left message for pt to call back  °

## 2017-09-26 ENCOUNTER — Telehealth: Payer: Self-pay

## 2017-09-26 ENCOUNTER — Encounter: Payer: Self-pay | Admitting: Family

## 2017-09-26 NOTE — Telephone Encounter (Signed)
Letter faxed.

## 2017-09-26 NOTE — Telephone Encounter (Signed)
Pt states she is waiting to get in with Dr. Terrence Dupont to have ECHO done.  Harwani's office wants a letter stating pt need to have ECHO. Pt states PCP wanted her to have this done. Let pt know I would send note to her PCP.

## 2017-09-26 NOTE — Telephone Encounter (Signed)
Copied from [place] [id number]. Topic: Inquiry >> Sep 26, 2017  3:20 PM Judyann Munson wrote: Reason for CRM: Zigmund Daniel-  from [facility] is calling  because they are needing to know why they need to do a Echo on the patient.  They are requesting a call back at [phone]. Please advise    -Patient was seen by Melissa on 09/03/17 Please advise

## 2017-09-26 NOTE — Telephone Encounter (Signed)
Please see letter.

## 2017-09-26 NOTE — Telephone Encounter (Signed)
Spoke with [employer] Advised her dx is JVD and there is no other know cardiac issues with pt.

## 2017-09-27 NOTE — Telephone Encounter (Signed)
Reviewed chart and do not see any other diagnosis we can use

## 2017-09-29 NOTE — Telephone Encounter (Signed)
Spoke with [employer] She states our facility will need to obtain pre-cert for this to be done at [facility]. They have availability as early as tomorrow afternoon once it is approved.  Zigmund Daniel provided [place] tax ID and NPI for their clinic. Information given to referral coordinator here to obtain pre-cert.

## 2017-09-29 NOTE — Telephone Encounter (Signed)
Zigmund Daniel is requesting to speak with nurse again regarding patients health history. CB [phone]

## 2017-09-29 NOTE — Telephone Encounter (Signed)
Per verba from referral coordinator, approval has been obtained. Left detailed message on pt's voicemail that she may call below # directly to schedule the echocardiogram or call us if any questions.

## 2017-10-24 ENCOUNTER — Encounter: Payer: Self-pay | Admitting: Family Medicine

## 2017-10-27 ENCOUNTER — Ambulatory Visit (INDEPENDENT_AMBULATORY_CARE_PROVIDER_SITE_OTHER): Payer: BLUE CROSS/BLUE SHIELD | Admitting: Gastroenterology

## 2017-10-27 ENCOUNTER — Encounter: Payer: Self-pay | Admitting: Gastroenterology

## 2017-10-27 VITALS — BP 136/82 | HR 67 | Ht 63.5 in | Wt 180.2 lb

## 2017-10-27 DIAGNOSIS — Z1211 Encounter for screening for malignant neoplasm of colon: Secondary | ICD-10-CM | POA: Diagnosis not present

## 2017-10-27 DIAGNOSIS — Z1212 Encounter for screening for malignant neoplasm of rectum: Secondary | ICD-10-CM

## 2017-10-27 NOTE — Progress Notes (Signed)
Chief Complaint:   Referring Provider:  Mosie Lukes, MD      ASSESSMENT AND PLAN;   #1.  Screening   #2.  Rectal bleeding (likely due to hoids- resolved)   HPI:    Anne Huffman is a 52 y.o. female  For colorectal cancer screening Had rectal bleeding in the past which is completely resolved-attributed to hemorrhoids.  Negative rectal exam. Had diarrhea due to Pepto-Bismol-resolved after stopping Pepto-Bismol. No nausea, vomiting, heartburn, regurgitation, odynophagia or dysphagia.  No significant diarrhea or constipation.  There is no melena or hematochezia. No unintentional weight loss. No abdominal pain. Was found to have increased questionable JVD-echo was unremarkable for CHF/right-sided failure. Showed ejection fraction 55 to 60%, normal RV function 10/07/2017 (Dr Helene Shoe) -report sent for scanning.   Past Medical History:  Diagnosis Date  . Allergy   . Asthma    since childhood  . Decreased visual acuity 04/21/2014  . Depression    no treatment  . Enlarged thyroid gland 03/26/2016  . Fatigue 08/20/2012  . Gastroenteritis 01/01/2015  . H. pylori infection 06/09/2013  . History of chicken pox    childhood  . Low back pain 03/26/2016  . Mild intermittent asthma 03/11/2011  . Preventative health care 09/21/2012  . Seasonal allergies   . Sickle cell trait ([place])   . Ulcer    stomach  . Vaginitis 03/26/2016    Past Surgical History:  Procedure Laterality Date  . NO PAST SURGERIES  03/11/2011    Family History  Problem Relation Age of Onset  . Breast cancer Maternal Grandmother   . Cancer Maternal Grandmother        breast  . Hypertension Mother        maternal aunt  . Eczema Mother   . Eczema Sister   . Eczema Sister   . Cancer Other        breast cancer  . Prostate cancer Neg Hx   . Colon cancer Neg Hx   . Heart disease Neg Hx   . Diabetes Neg Hx   . Depression Neg Hx   . Stroke Neg Hx   . Esophageal cancer Neg Hx   . Rectal cancer Neg Hx   .  Stomach cancer Neg Hx     Social History   Tobacco Use  . Smoking status: Never Smoker  . Smokeless tobacco: Never Used  Substance Use Topics  . Alcohol use: No  . Drug use: No    No current outpatient medications on file.   No current facility-administered medications for this visit.     Allergies  Allergen Reactions  . Peanuts [Peanut Oil] Itching    Review of Systems:  Constitutional: Denies fever, chills, diaphoresis, appetite change and fatigue.  HEENT: Denies photophobia, eye pain, redness, hearing loss, ear pain, congestion, sore throat, rhinorrhea, sneezing, mouth sores, neck pain, neck stiffness and tinnitus.   Respiratory: Denies SOB, DOE, cough, chest tightness,  and wheezing.   Cardiovascular: Denies chest pain, palpitations and leg swelling.  Genitourinary: Denies dysuria, urgency, frequency, hematuria, flank pain and difficulty urinating.  Musculoskeletal: Denies myalgias, back pain, joint swelling, arthralgias and gait problem.  Skin: No rash.  Neurological: Denies dizziness, seizures, syncope, weakness, light-headedness, numbness and headaches.  Hematological: Denies adenopathy. Easy bruising, personal or family bleeding history  Psychiatric/Behavioral: No anxiety or depression     Physical Exam:    BP 136/82   Pulse 67   Ht 5' 3.5" (1.613 m)  Wt 180 lb 4 oz (81.8 kg)   LMP 10/27/2015   BMI 31.43 kg/m  Filed Weights   10/27/17 1420  Weight: 180 lb 4 oz (81.8 kg)   Constitutional:  Well-developed, in no acute distress. Psychiatric: Normal mood and affect. Behavior is normal. HEENT: Pupils normal.  Conjunctivae are normal. No scleral icterus. Neck supple.  Cardiovascular: Normal rate, regular rhythm. No edema Pulmonary/chest: Effort normal and breath sounds normal. No wheezing, rales or rhonchi. Abdominal: Soft, nondistended. Nontender. Bowel sounds active throughout. There are no masses palpable. No hepatomegaly. Rectal:  defered Neurological:  Alert and oriented to person place and time. Skin: Skin is warm and dry. No rashes noted.  Data Reviewed: I have personally reviewed following labs and imaging studies  CBC: CBC Latest Ref Rng & Units 08/14/2017 03/26/2016 10/16/2015  WBC 4.0 - 10.5 K/uL 5.1 4.6 9.0  Hemoglobin 12.0 - 15.0 g/dL 13.6 13.8 13.9  Hematocrit 36.0 - 46.0 % 41.1 41.4 42.4  Platelets 150.0 - 400.0 K/uL 306.0 356.0 331    CMP: CMP Latest Ref Rng & Units 08/14/2017 03/26/2016 10/16/2015  Glucose 70 - 99 mg/dL 99 92 93  BUN 6 - 23 mg/dL 10 13 15   Creatinine 0.40 - 1.20 mg/dL 0.67 0.68 0.80  Sodium 135 - 145 mEq/L 140 138 142  Potassium 3.5 - 5.1 mEq/L 4.1 3.9 4.5  Chloride 96 - 112 mEq/L 105 103 106  CO2 19 - 32 mEq/L 29 31 27   Calcium 8.4 - 10.5 mg/dL 9.4 9.6 9.5  Total Protein 6.0 - 8.3 g/dL 7.1 7.8 -  Total Bilirubin 0.2 - 1.2 mg/dL 0.8 0.8 -  Alkaline Phos 39 - 117 U/L 100 85 -  AST 0 - 37 U/L 8 12 -  ALT 0 - 35 U/L 11 11 -     Carmell Austria, MD 10/27/2017, 2:45 PM  Cc: Mosie Lukes, MD

## 2017-10-27 NOTE — Patient Instructions (Signed)
If you are age 51 or older, your body mass index should be between 23-30. Your Body mass index is 31.43 kg/m. If this is out of the aforementioned range listed, please consider follow up with your Primary Care Provider.  If you are age 65 or younger, your body mass index should be between 19-25. Your Body mass index is 31.43 kg/m. If this is out of the aformentioned range listed, please consider follow up with your Primary Care Provider.   You have been scheduled for a colonoscopy. Please follow written instructions given to you at your visit today.  Please pick up your prep supplies at the pharmacy within the next 1-3 days. If you use inhalers (even only as needed), please bring them with you on the day of your procedure. Your physician has requested that you go to www.startemmi.com and enter the access code given to you at your visit today. This web site gives a general overview about your procedure. However, you should still follow specific instructions given to you by our office regarding your preparation for the procedure.  Thank you,  Dr. Jackquline Denmark

## 2017-10-27 NOTE — Addendum Note (Signed)
Addended by: Karena Addison on: 10/27/2017 03:26 PM   Modules accepted: Orders

## 2017-11-07 ENCOUNTER — Encounter: Payer: Self-pay | Admitting: Gastroenterology

## 2017-11-20 ENCOUNTER — Telehealth: Payer: Self-pay | Admitting: Gastroenterology

## 2017-11-20 NOTE — Telephone Encounter (Signed)
This patient called me through answering service at 10:30 PM stating she forgot to take 6pm prep dose for 8AM screening colonoscopy 11/1.  I told her the procedure will need to be rescheduled.  Please have staff contact her to do so.

## 2017-11-21 ENCOUNTER — Encounter: Payer: BLUE CROSS/BLUE SHIELD | Admitting: Gastroenterology

## 2017-11-21 NOTE — Telephone Encounter (Signed)
Spoke to pt and rescheduled for 01-06-18 at 1pm.

## 2017-11-21 NOTE — Telephone Encounter (Signed)
Please reschedule pt for colon with [facility].

## 2017-11-21 NOTE — Telephone Encounter (Signed)
Can we reschedule colonoscopy for this patient

## 2018-01-06 ENCOUNTER — Encounter: Payer: Self-pay | Admitting: Gastroenterology

## 2018-01-06 ENCOUNTER — Ambulatory Visit (AMBULATORY_SURGERY_CENTER): Payer: BLUE CROSS/BLUE SHIELD | Admitting: Gastroenterology

## 2018-01-06 VITALS — BP 154/79 | HR 73 | Temp 98.9°F | Resp 19 | Ht 63.0 in | Wt 180.0 lb

## 2018-01-06 DIAGNOSIS — K635 Polyp of colon: Secondary | ICD-10-CM

## 2018-01-06 DIAGNOSIS — Z1211 Encounter for screening for malignant neoplasm of colon: Secondary | ICD-10-CM | POA: Diagnosis present

## 2018-01-06 DIAGNOSIS — D123 Benign neoplasm of transverse colon: Secondary | ICD-10-CM

## 2018-01-06 MED ORDER — SODIUM CHLORIDE 0.9 % IV SOLN: 500.0000 mL | Freq: Once | INTRAVENOUS | Status: DC

## 2018-01-06 NOTE — Progress Notes (Signed)
Called to room to assist during endoscopic procedure.  Patient ID and intended procedure confirmed with present staff. Received instructions for my participation in the procedure from the performing physician.  

## 2018-01-06 NOTE — Op Note (Signed)
[facility] Patient Name: Anne Huffman Procedure Date: 01/06/2018 1:01 PM MRN: [id number] Endoscopist: Jackquline Denmark , MD Age: 52 Referring MD:  Date of Birth: [birth year] Gender: Female Account #: [id number] Procedure:                Colonoscopy Indications:              Screening for colorectal malignant neoplasm Medicines:                Monitored Anesthesia Care Procedure:                Pre-Anesthesia Assessment:                           - Prior to the procedure, a History and Physical                            was performed, and patient medications and                            allergies were reviewed. The patient's tolerance of                            previous anesthesia was also reviewed. The risks                            and benefits of the procedure and the sedation                            options and risks were discussed with the patient.                            All questions were answered, and informed consent                            was obtained. Prior Anticoagulants: The patient has                            taken no previous anticoagulant or antiplatelet                            agents. ASA Grade Assessment: II - A patient with                            mild systemic disease. After reviewing the risks                            and benefits, the patient was deemed in                            satisfactory condition to undergo the procedure.                           After obtaining informed consent, the colonoscope  was passed under direct vision. Throughout the                            procedure, the patient's blood pressure, pulse, and                            oxygen saturations were monitored continuously. The                            Model PCF-H190DL [id number]) scope was introduced                            through the anus and advanced to the 2 cm into the                            ileum. The  colonoscopy was performed without                            difficulty. The patient tolerated the procedure                            well. The quality of the bowel preparation was                            excellent. Scope In: 1:08:31 PM Scope Out: 1:20:49 PM Scope Withdrawal Time: 0 hours 8 minutes 48 seconds  Total Procedure Duration: 0 hours 12 minutes 18 seconds  Findings:                 A 6 mm polyp was found in the proximal transverse                            colon. The polyp was sessile. The polyp was removed                            with a cold snare. Resection and retrieval were                            complete. Estimated blood loss: none.                           Non-bleeding internal hemorrhoids were found during                            retroflexion. The hemorrhoids were small.                           The exam was otherwise without abnormality on                            direct and retroflexion views. Complications:            No immediate complications. Estimated Blood Loss:     Estimated blood loss: none. Impression:               -  Colonic polyp status post polypectomy                           - Non-bleeding internal hemorrhoids.                           - Otherwise Normal to TI. Recommendation:           - Patient has a contact number available for                            emergencies. The signs and symptoms of potential                            delayed complications were discussed with the                            patient. Return to normal activities tomorrow.                            Written discharge instructions were provided to the                            patient.                           - High fiber diet.                           - Continue present medications.                           - Continue present medications.                           - Await pathology results.                           - Repeat colonoscopy for  surveillance based on                            pathology results.                           - Return to GI clinic PRN. Jackquline Denmark, MD 01/06/2018 1:34:52 PM This report has been signed electronically.

## 2018-01-06 NOTE — Patient Instructions (Signed)
YOU HAD AN ENDOSCOPIC PROCEDURE TODAY AT THE [facility]:   Refer to the procedure report that was given to you for any specific questions about what was found during the examination.  If the procedure report does not answer your questions, please call your gastroenterologist to clarify.  If you requested that your care partner not be given the details of your procedure findings, then the procedure report has been included in a sealed envelope for you to review at your convenience later.  YOU SHOULD EXPECT: Some feelings of bloating in the abdomen. Passage of more gas than usual.  Walking can help get rid of the air that was put into your GI tract during the procedure and reduce the bloating. If you had a lower endoscopy (such as a colonoscopy or flexible sigmoidoscopy) you may notice spotting of blood in your stool or on the toilet paper. If you underwent a bowel prep for your procedure, you may not have a normal bowel movement for a few days.  Please Note:  You might notice some irritation and congestion in your nose or some drainage.  This is from the oxygen used during your procedure.  There is no need for concern and it should clear up in a day or so.  SYMPTOMS TO REPORT IMMEDIATELY:   Following lower endoscopy (colonoscopy or flexible sigmoidoscopy):  Excessive amounts of blood in the stool  Significant tenderness or worsening of abdominal pains  Swelling of the abdomen that is new, acute  Fever of 100F or higher  For urgent or emergent issues, a gastroenterologist can be reached at any hour by calling [phone].   DIET:  We do recommend a small meal at first, but then you may proceed to your regular diet.  Drink plenty of fluids but you should avoid alcoholic beverages for 24 hours.  ACTIVITY:  You should plan to take it easy for the rest of today and you should NOT DRIVE or use heavy machinery until tomorrow (because of the sedation medicines used during the test).     FOLLOW UP: Our staff will call the number listed on your records the next business day following your procedure to check on you and address any questions or concerns that you may have regarding the information given to you following your procedure. If we do not reach you, we will leave a message.  However, if you are feeling well and you are not experiencing any problems, there is no need to return our call.  We will assume that you have returned to your regular daily activities without incident.  If any biopsies were taken you will be contacted by phone or by letter within the next 1-3 weeks.  Please call us at [phone] if you have not heard about the biopsies in 3 weeks.    SIGNATURES/CONFIDENTIALITY: You and/or your care partner have signed paperwork which will be entered into your electronic medical record.  These signatures attest to the fact that that the information above on your After Visit Summary has been reviewed and is understood.  Full responsibility of the confidentiality of this discharge information lies with you and/or your care-partner. 

## 2018-01-06 NOTE — Progress Notes (Signed)
Report given to PACU, vss 

## 2018-01-06 NOTE — Progress Notes (Signed)
Pt's states no medical or surgical changes since previsit or office visit. 

## 2018-01-07 ENCOUNTER — Telehealth: Payer: Self-pay

## 2018-01-07 ENCOUNTER — Telehealth: Payer: Self-pay | Admitting: *Deleted

## 2018-01-07 NOTE — Telephone Encounter (Signed)
  Follow up Call-  Call back number 01/06/2018  Post procedure Call Back phone  # [phone]  Permission to leave phone message Yes  Some recent data might be hidden     Left message

## 2018-01-07 NOTE — Telephone Encounter (Signed)
  Follow up Call-  Call back number 01/06/2018  Post procedure Call Back phone  # [phone]  Permission to leave phone message Yes  Some recent data might be hidden     Patient questions:  Do you have a fever, pain , or abdominal swelling? No. Pain Score  0 *  Have you tolerated food without any problems? Yes.    Have you been able to return to your normal activities? Yes.    Do you have any questions about your discharge instructions: Diet   No. Medications  No. Follow up visit  No.  Do you have questions or concerns about your Care? No.  Actions: * If pain score is 4 or above: No action needed, pain <4.

## 2018-01-12 ENCOUNTER — Encounter: Payer: Self-pay | Admitting: Gastroenterology

## 2018-04-14 ENCOUNTER — Encounter (HOSPITAL_BASED_OUTPATIENT_CLINIC_OR_DEPARTMENT_OTHER): Payer: Self-pay | Admitting: Emergency Medicine

## 2018-04-14 ENCOUNTER — Emergency Department (HOSPITAL_BASED_OUTPATIENT_CLINIC_OR_DEPARTMENT_OTHER)
Admission: EM | Admit: 2018-04-14 | Discharge: 2018-04-14 | Disposition: A | Discharge status: Home / Self Care | Payer: BLUE CROSS/BLUE SHIELD | Attending: Emergency Medicine | Admitting: Emergency Medicine

## 2018-04-14 ENCOUNTER — Other Ambulatory Visit: Payer: Self-pay

## 2018-04-14 DIAGNOSIS — D573 Sickle-cell trait: Secondary | ICD-10-CM | POA: Diagnosis not present

## 2018-04-14 DIAGNOSIS — J45909 Unspecified asthma, uncomplicated: Secondary | ICD-10-CM | POA: Insufficient documentation

## 2018-04-14 DIAGNOSIS — M779 Enthesopathy, unspecified: Secondary | ICD-10-CM | POA: Diagnosis not present

## 2018-04-14 DIAGNOSIS — F329 Major depressive disorder, single episode, unspecified: Secondary | ICD-10-CM | POA: Insufficient documentation

## 2018-04-14 DIAGNOSIS — M79642 Pain in left hand: Secondary | ICD-10-CM | POA: Diagnosis present

## 2018-04-14 DIAGNOSIS — M65332 Trigger finger, left middle finger: Secondary | ICD-10-CM | POA: Insufficient documentation

## 2018-04-14 DIAGNOSIS — Z91010 Allergy to peanuts: Secondary | ICD-10-CM | POA: Diagnosis not present

## 2018-04-14 NOTE — ED Notes (Signed)
Patient verbalizes understanding of discharge instructions. Opportunity for questioning and answers were provided. Armband removed by staff, pt discharged from ED.  

## 2018-04-14 NOTE — Discharge Instructions (Addendum)
Take Tylenol 1000 mg 4 times a day for 1 week. This is the maximum dose of Tylenol (acetaminophen) you can take from all sources. Please check other over-the-counter medications and prescriptions to ensure you are not taking other medications that contain acetaminophen.  You may also take ibuprofen 400 mg 6 times a day alternating with or at the same time as tylenol.  You may wear your splint for comfort.

## 2018-04-14 NOTE — ED Triage Notes (Signed)
Pt is c/o left hand pain  Denies injury

## 2018-04-14 NOTE — ED Provider Notes (Signed)
[facility] EMERGENCY DEPARTMENT Provider Note   CSN: [id number] Arrival date & time: 04/14/18  7858    History   Chief Complaint Chief Complaint  Patient presents with  . Hand Pain    HPI Anne Huffman is a 53 y.o. female.     HPI   Presents with concern for left hand pain, present left hand across whole palm with radiation down 4th and 5th finger, tingling, throbbing. No specific injury. 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 Was trying to put hand in gloves then felt pain through finger.  No fevers, no no rashes or skin injury.  In 3rd finger sometimes would stick a bit, get stuck, and have occasional aching. Difficulty bending finger with pain.   Past Medical History:  Diagnosis Date  . Allergy   . Asthma    since childhood  . Decreased visual acuity 04/21/2014  . Depression    no treatment  . Enlarged thyroid gland 03/26/2016  . Fatigue 08/20/2012  . Gastroenteritis 01/01/2015  . H. pylori infection 06/09/2013  . History of chicken pox    childhood  . Low back pain 03/26/2016  . Mild intermittent asthma 03/11/2011  . Preventative health care 09/21/2012  . Seasonal allergies   . Sickle cell trait ([place])   . Ulcer    stomach  . Vaginitis 03/26/2016    Patient Active Problem List   Diagnosis Date Noted  . Enlarged thyroid gland 03/26/2016  . Low back pain 03/26/2016  . Vaginitis 03/26/2016  . Decreased visual acuity 04/21/2014  . GERD (gastroesophageal reflux disease) 06/09/2013  . Preventative health care 09/21/2012  . Cervical cancer screening 09/18/2012  . Fatigue 08/20/2012  . Dermatitis 07/18/2011  . Lung nodule 05/16/2011  . Neck pain on right side 04/06/2011  . Radicular pain in right arm 04/06/2011  . Breast cancer screening 03/11/2011  . Anemia 03/11/2011  . Abnormal liver enzymes 03/11/2011  . Mild intermittent asthma 03/11/2011    Past Surgical History:  Procedure Laterality Date  . NO PAST SURGERIES  03/11/2011     OB History   No  obstetric history on file.      Home Medications    Prior to Admission medications   Not on File    Family History Family History  Problem Relation Age of Onset  . Breast cancer Maternal Grandmother   . Cancer Maternal Grandmother        breast  . Hypertension Mother        maternal aunt  . Eczema Mother   . Eczema Sister   . Eczema Sister   . Cancer Other        breast cancer  . Prostate cancer Neg Hx   . Colon cancer Neg Hx   . Heart disease Neg Hx   . Diabetes Neg Hx   . Depression Neg Hx   . Stroke Neg Hx   . Esophageal cancer Neg Hx   . Rectal cancer Neg Hx   . Stomach cancer Neg Hx     Social History Social History   Tobacco Use  . Smoking status: Never Smoker  . Smokeless tobacco: Never Used  Substance Use Topics  . Alcohol use: No  . Drug use: No     Allergies   Peanuts [peanut oil]   Review of Systems Review of Systems  Constitutional: Negative for fever.  Respiratory: Negative for cough and shortness of breath.   Cardiovascular: Negative for chest pain.  Musculoskeletal:  Positive for arthralgias.  Skin: Negative for rash.  Neurological: Negative for syncope.     Physical Exam Updated Vital Signs BP (!) 156/86   Pulse 79   Temp 97.7 F (36.5 C) (Oral)   Resp 16   Ht 5\' 3"  (1.6 m)   Wt 81.6 kg   LMP 10/27/2015   SpO2 100%   BMI 31.89 kg/m   Physical Exam Vitals signs and nursing note reviewed.  Constitutional:      General: She is not in acute distress.    Appearance: She is well-developed. She is not diaphoretic.  HENT:     Head: Normocephalic and atraumatic.  Eyes:     Conjunctiva/sclera: Conjunctivae normal.  Neck:     Musculoskeletal: Normal range of motion.  Cardiovascular:     Rate and Rhythm: Normal rate and regular rhythm.  Pulmonary:     Effort: Pulmonary effort is normal. No respiratory distress.  Abdominal:     Palpations: Abdomen is soft.  Musculoskeletal:     Left hand: She exhibits decreased range of  motion (pain with movements. difficult flexing middle finger (chronic), pain with flexing and not full ROM of ring and pinky) and tenderness (MCP). She exhibits normal capillary refill, no deformity, no laceration and no swelling. Normal sensation noted.  Skin:    General: Skin is warm and dry.     Findings: No erythema or rash.  Neurological:     Mental Status: She is alert and oriented to person, place, and time.      ED Treatments / Results  Labs (all labs ordered are listed, but only abnormal results are displayed) Labs Reviewed - No data to display  EKG None  Radiology No results found.  Procedures Procedures (including critical care time)  Medications Ordered in ED Medications - No data to display   Initial Impression / Assessment and Plan / ED Course  I have reviewed the triage vital signs and the nursing notes.  Pertinent labs & imaging results that were available during my care of the patient were reviewed by me and considered in my medical decision making (see chart for details).       53 year old female presents with concern for left hand pain.  No erythema on exam, no fevers, no sign of abscess, flexor tenosynovitis, septic arthritis, or other infectious etiology.  Has a clear history of trigger finger in the middle finger, and have concern for similar pathology now in the ring and pinky finger.  She does work as a Quarry manager, moving patients, and likely represents overuse injury, tendinitis, or stenosing tenosynovitis.  Discussed other possible etiologies of her pain include rheumatologic.  Recommend wearing finger splint as needed for comfort, NSAIDs, ice and rest.  Provided number for hand surgery follow-up, although we discussed that is unlikely that she will be able to follow-up closely in setting of global pandemic.    Final Clinical Impressions(s) / ED Diagnoses   Final diagnoses:  Trigger middle finger of left hand  Tendonitis    ED Discharge Orders    None        Gareth Morgan, MD 04/14/18 1413

## 2018-04-14 NOTE — ED Notes (Signed)
ED Provider at bedside. 

## 2018-06-23 ENCOUNTER — Encounter (HOSPITAL_BASED_OUTPATIENT_CLINIC_OR_DEPARTMENT_OTHER): Payer: Self-pay | Admitting: Emergency Medicine

## 2018-06-23 ENCOUNTER — Emergency Department (HOSPITAL_BASED_OUTPATIENT_CLINIC_OR_DEPARTMENT_OTHER): Payer: BC Managed Care – PPO

## 2018-06-23 ENCOUNTER — Emergency Department (HOSPITAL_BASED_OUTPATIENT_CLINIC_OR_DEPARTMENT_OTHER)
Admission: EM | Admit: 2018-06-23 | Discharge: 2018-06-23 | Disposition: A | Discharge status: Home / Self Care | Payer: BC Managed Care – PPO | Attending: Emergency Medicine | Admitting: Emergency Medicine

## 2018-06-23 ENCOUNTER — Other Ambulatory Visit: Payer: Self-pay

## 2018-06-23 DIAGNOSIS — S20219A Contusion of unspecified front wall of thorax, initial encounter: Secondary | ICD-10-CM | POA: Insufficient documentation

## 2018-06-23 DIAGNOSIS — S299XXA Unspecified injury of thorax, initial encounter: Secondary | ICD-10-CM | POA: Diagnosis present

## 2018-06-23 DIAGNOSIS — Y929 Unspecified place or not applicable: Secondary | ICD-10-CM | POA: Insufficient documentation

## 2018-06-23 DIAGNOSIS — Y9389 Activity, other specified: Secondary | ICD-10-CM | POA: Diagnosis not present

## 2018-06-23 DIAGNOSIS — Y999 Unspecified external cause status: Secondary | ICD-10-CM | POA: Diagnosis not present

## 2018-06-23 DIAGNOSIS — J4520 Mild intermittent asthma, uncomplicated: Secondary | ICD-10-CM | POA: Insufficient documentation

## 2018-06-23 NOTE — ED Notes (Signed)
ED Provider at bedside. 
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
## 2018-07-01 ENCOUNTER — Encounter: Payer: Self-pay | Admitting: Family Medicine

## 2018-07-01 ENCOUNTER — Ambulatory Visit (INDEPENDENT_AMBULATORY_CARE_PROVIDER_SITE_OTHER): Payer: BC Managed Care – PPO | Admitting: Family Medicine

## 2018-07-01 DIAGNOSIS — M940 Chondrocostal junction syndrome [Tietze]: Secondary | ICD-10-CM | POA: Insufficient documentation

## 2018-07-01 MED ORDER — METHYLPREDNISOLONE 4 MG PO TABS: ORAL_TABLET | ORAL | 0 refills | Status: DC

## 2018-07-01 MED ORDER — TIZANIDINE HCL 4 MG PO TABS: 2.0000 mg | ORAL_TABLET | Freq: Two times a day (BID) | ORAL | 0 refills | Status: DC | PRN
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
## 2018-07-02 ENCOUNTER — Telehealth: Payer: Self-pay

## 2018-07-02 NOTE — Telephone Encounter (Signed)
Printed new letter to patient  lvm for patient
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
## 2018-07-22 ENCOUNTER — Ambulatory Visit (INDEPENDENT_AMBULATORY_CARE_PROVIDER_SITE_OTHER): Payer: BC Managed Care – PPO | Admitting: Family

## 2018-07-22 ENCOUNTER — Encounter: Payer: Self-pay | Admitting: Family

## 2018-07-22 ENCOUNTER — Other Ambulatory Visit: Payer: Self-pay

## 2018-07-22 VITALS — BP 143/73 | HR 72 | Temp 98.4°F | Resp 16 | Ht 63.0 in | Wt 190.0 lb

## 2018-07-22 DIAGNOSIS — Z0000 Encounter for general adult medical examination without abnormal findings: Secondary | ICD-10-CM

## 2018-07-22 MED ORDER — EPINEPHRINE 0.3 MG/0.3ML IJ SOAJ: 0.3000 mg | INTRAMUSCULAR | 1 refills | Status: AC | PRN

## 2018-07-22 NOTE — Progress Notes (Signed)
Subjective:    Patient ID: Anne Huffman, female    DOB: [birth year], 53 y.o.   MRN: [id number]  HPI  Patient is a 53 yr old female who presents today for a complete physical.  Immunizations: tdap 2015, declines shingrix Diet: needs improvement  Breakfast- [employer] (egg mcmuffin or hot cakes with has brown) or [place] hotcakes/fried steak biscuit combo (sweet tea or OJ)  Lunch-hamburger or fries, sweet tea rare soda  Dinner- reports that she usually eats out. Eats a salad or hot dogs.   Snacks at work a lot   Lives alone   Exercise: No dedicated exercise Wt Readings from Last 3 Encounters:  07/22/18 190 lb (86.2 kg)  06/23/18 187 lb 1.6 oz (84.9 kg)  04/14/18 180 lb (81.6 kg)    Colonoscopy: 01/06/18 Pap Smear:  03/26/16 Mammogram: 08/14/17 Vision: >2 yrs ago Dental: due  BP Readings from Last 3 Encounters:  07/22/18 (!) 143/73  06/23/18 (!) 142/76  04/14/18 (!) 156/86     Review of Systems  Constitutional: Negative for unexpected weight change.  HENT: Positive for rhinorrhea. Negative for hearing loss.   Eyes: Negative for visual disturbance.  Respiratory: Negative for shortness of breath.        Some clear mucous in her chest.   Cardiovascular: Negative for chest pain.  Gastrointestinal: Negative for diarrhea.       Occasional constipation with cheese  Genitourinary: Negative for dysuria, frequency and hematuria.  Musculoskeletal: Negative for arthralgias (reports some left sided knee pain for a few days. Feels like this is related to the shoes she has been wearing) and myalgias.  Skin: Negative for rash.  Neurological:       Reports + HA with pork  Psychiatric/Behavioral:       Denies depression/anxiety   Past Medical History:  Diagnosis Date  . Allergy   . Asthma    since childhood  . Decreased visual acuity 04/21/2014  . Depression    no treatment  . Enlarged thyroid gland 03/26/2016  . Fatigue 08/20/2012  . Gastroenteritis 01/01/2015  . H.  pylori infection 06/09/2013  . History of chicken pox    childhood  . Low back pain 03/26/2016  . Mild intermittent asthma 03/11/2011  . Preventative health care 09/21/2012  . Seasonal allergies   . Sickle cell trait ([place])   . Ulcer    stomach  . Vaginitis 03/26/2016     Social History   Socioeconomic History  . Marital status: Single    Spouse name: Not on file  . Number of children: 3  . Years of education: Not on file  . Highest education level: Not on file  Occupational History  . Not on file  Social Needs  . Financial resource strain: Not on file  . Food insecurity    Worry: Not on file    Inability: Not on file  . Transportation needs    Medical: Not on file    Non-medical: Not on file  Tobacco Use  . Smoking status: Never Smoker  . Smokeless tobacco: Never Used  Substance and Sexual Activity  . Alcohol use: No  . Drug use: No  . 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 Physical activity    Days per week: Not on file    Minutes per session: Not on file  . Stress: Not on file  Relationships  . Social  connections    Talks on phone: Not on file    Gets together: Not on file    Attends religious service: Not on file    Active member of club or organization: Not on file    Attends meetings of clubs or organizations: Not on file    Relationship status: Not on file  . Intimate partner violence    Fear of current or ex partner: Not on file    Emotionally abused: Not on file    Physically abused: Not on file    Forced sexual activity: Not on file  Other Topics Concern  . 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 NO PAST SURGERIES  03/11/2011    Family History  Problem Relation Age of Onset  . Breast cancer Maternal Grandmother   . Cancer Maternal Grandmother        breast  . Hypertension  Mother        maternal aunt  . Eczema Mother   . Eczema Sister   . Eczema Sister   . Cancer Other        breast cancer  . Prostate cancer Neg Hx   . Colon cancer Neg Hx   . Heart disease Neg Hx   . Diabetes Neg Hx   . Depression Neg Hx   . Stroke Neg Hx   . Esophageal cancer Neg Hx   . Rectal cancer Neg Hx   . Stomach cancer Neg Hx     Allergies  Allergen Reactions  . Other Anaphylaxis  . Peanuts [Peanut Oil] Itching  . Pork-Derived Products     headache    Current Outpatient Medications on File Prior to Visit  Medication Sig Dispense Refill  . ibuprofen (ADVIL) 800 MG tablet      No current facility-administered medications on file prior to visit.     BP (!) 143/73 (BP Location: Right Arm, Patient Position: Sitting, Cuff Size: Small)   Pulse 72   Temp 98.4 F (36.9 C) (Oral)   Resp 16   Ht 5\' 3"  (1.6 m)   Wt 190 lb (86.2 kg)   LMP 10/27/2015   SpO2 98%   BMI 33.66 kg/m       Objective:   Physical Exam  Physical Exam  Constitutional: She is oriented to person, place, and time. She appears well-developed and well-nourished. No distress.  HENT:  Head: Normocephalic and atraumatic.  Right Ear: Tympanic membrane and ear canal normal.  Left Ear: Tympanic membrane and ear canal normal.  Mouth/Throat: not examined- pt wearing mask Eyes: Pupils are equal, round, and reactive to light. No scleral icterus.  Neck: Normal range of motion. No thyromegaly present.  Cardiovascular: Normal rate and regular rhythm.  No murmur heard. Pulmonary/Chest: Effort normal and breath sounds normal. No respiratory distress. He has no wheezes. She has no rales. She exhibits no tenderness.  Abdominal: Soft. Bowel sounds are normal. She exhibits no distension and no mass. There is no tenderness. There is no rebound and no guarding.  Musculoskeletal: She exhibits no edema.  Lymphadenopathy:    She has no cervical adenopathy.  Neurological: She is alert and oriented to person, place,  and time. She has normal patellar reflexes. She exhibits normal muscle tone. Coordination normal.  Skin: Skin is warm and dry.  Psychiatric: She has a normal mood and affect. Her behavior is normal. Judgment  and thought content normal.  Breasts: Examined lying Right: Without masses, retractions, discharge or axillary adenopathy.  Left: Without masses, retractions, discharge or axillary adenopathy.           Assessment & Plan:    Preventative care- we discussed dietary changes and weight loss at length today as well as exercise. Colo/pap up to date. Mammo will be due in a few weeks and pt is advised to schedule. She is also advised to schedule routine dental and vision exams.  Obtain routine lab work.     Assessment & Plan:

## 2018-07-22 NOTE — Patient Instructions (Addendum)
Please schedule follow up with Dental, eye doctor and Mammogram. Work on stopping sweet tea, adding more fresh fruits and veggies and cooking more. Add 30 minutes 5 days a week of walking.

## 2018-07-23 LAB — CBC WITH DIFFERENTIAL/PLATELET
Basophils Absolute: 0 10*3/uL (ref 0.0–0.1)
Basophils Relative: 0.5 % (ref 0.0–3.0)
Eosinophils Absolute: 0.4 10*3/uL (ref 0.0–0.7)
Eosinophils Relative: 5 % (ref 0.0–5.0)
HCT: 38.6 % (ref 36.0–46.0)
Hemoglobin: 12.7 g/dL (ref 12.0–15.0)
Lymphocytes Relative: 30.4 % (ref 12.0–46.0)
Lymphs Abs: 2.2 10*3/uL (ref 0.7–4.0)
MCHC: 32.9 g/dL (ref 30.0–36.0)
MCV: 90.9 fl (ref 78.0–100.0)
Monocytes Absolute: 0.5 10*3/uL (ref 0.1–1.0)
Monocytes Relative: 7.3 % (ref 3.0–12.0)
Neutro Abs: 4.2 10*3/uL (ref 1.4–7.7)
Neutrophils Relative %: 56.8 % (ref 43.0–77.0)
Platelets: 286 10*3/uL (ref 150.0–400.0)
RBC: 4.24 Mil/uL (ref 3.87–5.11)
RDW: 13.4 % (ref 11.5–15.5)
WBC: 7.3 10*3/uL (ref 4.0–10.5)

## 2018-07-23 LAB — HEPATIC FUNCTION PANEL
ALT: 10 U/L (ref 0–35)
AST: 10 U/L (ref 0–37)
Albumin: 4 g/dL (ref 3.5–5.2)
Alkaline Phosphatase: 95 U/L (ref 39–117)
Bilirubin, Direct: 0.1 mg/dL (ref 0.0–0.3)
Total Bilirubin: 0.4 mg/dL (ref 0.2–1.2)
Total Protein: 6.9 g/dL (ref 6.0–8.3)

## 2018-07-23 LAB — BASIC METABOLIC PANEL
BUN: 17 mg/dL (ref 6–23)
CO2: 27 mEq/L (ref 19–32)
Calcium: 9.2 mg/dL (ref 8.4–10.5)
Chloride: 104 mEq/L (ref 96–112)
Creatinine, Ser: 0.72 mg/dL (ref 0.40–1.20)
GFR: 102.97 mL/min (ref >60.00)
Glucose, Bld: 110 mg/dL — ABNORMAL HIGH (ref 70–99)
Potassium: 4.3 mEq/L (ref 3.5–5.1)
Sodium: 139 mEq/L (ref 135–145)

## 2018-07-23 LAB — LIPID PANEL
Cholesterol: 181 mg/dL (ref 0–200)
HDL: 59.1 mg/dL (ref >39.00)
LDL Cholesterol: 104 mg/dL — ABNORMAL HIGH (ref 0–99)
NonHDL: 122.05
Total CHOL/HDL Ratio: 3
Triglycerides: 91 mg/dL (ref 0.0–149.0)
VLDL: 18.2 mg/dL (ref 0.0–40.0)

## 2018-07-23 LAB — TSH: TSH: 1.03 u[IU]/mL (ref 0.35–4.50)

## 2018-08-07 ENCOUNTER — Other Ambulatory Visit: Payer: Self-pay

## 2018-08-10 ENCOUNTER — Encounter: Payer: BLUE CROSS/BLUE SHIELD | Admitting: Family Medicine

## 2018-08-10 ENCOUNTER — Ambulatory Visit (INDEPENDENT_AMBULATORY_CARE_PROVIDER_SITE_OTHER): Payer: BC Managed Care – PPO | Admitting: Family

## 2018-08-10 DIAGNOSIS — Z20828 Contact with and (suspected) exposure to other viral communicable diseases: Secondary | ICD-10-CM

## 2018-08-10 DIAGNOSIS — Z20822 Contact with and (suspected) exposure to covid-19: Secondary | ICD-10-CM

## 2018-08-10 NOTE — Progress Notes (Addendum)
Virtual Visit via Video Note  I connected with Anne Huffman on 08/10/18 at 12:40 PM EDT by a video enabled telemedicine application and verified that I am speaking with the correct person using two identifiers.  Location: Patient: parked car Provider: home   I discussed the limitations of evaluation and management by telemedicine and the availability of in person appointments. The patient expressed understanding and agreed to proceed.  History of Present Illness:  Patient is a 53 yr old female who presents today with chief complaint of weakness and chills.  She denies loss of taste or smell.  Reports that she has had a few episodes of sob last week but none currently. Denies cough.  No fever. She takes her temperature for work.  Works as a Quarry manager at a [employer].    Patient reports that last week she felt sluggish, like I couldn't "get enough rest." Had several episodes of chills.  Reports that she had a letter last week that there was a worker who tested positive for covid at her job.    Reports that she has been around her grandchildren a lot. They have been healthy to her knowledge.   Past Medical History:  Diagnosis Date  . Allergy   . Asthma    since childhood  . Decreased visual acuity 04/21/2014  . Depression    no treatment  . Enlarged thyroid gland 03/26/2016  . Fatigue 08/20/2012  . Gastroenteritis 01/01/2015  . H. pylori infection 06/09/2013  . History of chicken pox    childhood  . Low back pain 03/26/2016  . Mild intermittent asthma 03/11/2011  . Preventative health care 09/21/2012  . Seasonal allergies   . Sickle cell trait ([place])   . Ulcer    stomach  . Vaginitis 03/26/2016     Social History   Socioeconomic History  . Marital status: Single    Spouse name: Not on file  . Number of children: 3  . Years of education: Not on file  . Highest education level: Not on file  Occupational History  . Not on file  Social Needs  . Financial resource strain: Not on  file  . Food insecurity    Worry: Not on file    Inability: Not on file  . Transportation needs    Medical: Not on file    Non-medical: Not on file  Tobacco Use  . Smoking status: Never Smoker  . Smokeless tobacco: Never Used  Substance and Sexual Activity  . Alcohol use: No  . Drug use: No  . 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 Physical activity    Days per week: Not on file    Minutes per session: Not on file  . Stress: Not on file  Relationships  . Social Herbalist on phone: Not on file    Gets together: Not on file    Attends religious service: Not on file    Active member of club or organization: Not on file    Attends meetings of clubs or organizations: Not on file    Relationship status: Not on file  . Intimate partner violence    Fear of current or ex partner: Not on file    Emotionally abused: Not on file    Physically abused: Not on file    Forced sexual activity: Not on file  Other Topics Concern  .  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 NO PAST SURGERIES  03/11/2011    Family History  Problem Relation Age of Onset  . Breast cancer Maternal Grandmother   . Cancer Maternal Grandmother        breast  . Hypertension Mother        maternal aunt  . Eczema Mother   . Eczema Sister   . Eczema Sister   . Cancer Other        breast cancer  . Prostate cancer Neg Hx   . Colon cancer Neg Hx   . Heart disease Neg Hx   . Diabetes Neg Hx   . Depression Neg Hx   . Stroke Neg Hx   . Esophageal cancer Neg Hx   . Rectal cancer Neg Hx   . Stomach cancer Neg Hx     Allergies  Allergen Reactions  . Other Anaphylaxis  . Peanuts [Peanut Oil] Itching  . Pork-Derived Products     headache    Current Outpatient Medications on File Prior to Visit  Medication Sig Dispense  Refill  . EPINEPHrine (EPIPEN 2-PAK) 0.3 mg/0.3 mL IJ SOAJ injection Inject 0.3 mLs (0.3 mg total) into the muscle as needed for anaphylaxis. 1 each 1  . ibuprofen (ADVIL) 800 MG tablet      No current facility-administered medications on file prior to visit.     LMP 10/27/2015       Observations/Objective:   Gen: Awake, alert, no acute distress Resp: Breathing is even and non-labored Psych: calm/pleasant demeanor Neuro: Alert and Oriented x 3, + facial symmetry, speech is clear.   Assessment and Plan:   Fatigue/covid-19 exposure- symptoms are currently resolved. Clinically doubt covid and we discussed that if she was positive for covid last week if we did nasal swab now it would likely be negative.  She is interested in COVID antibody testing.  I have advised the patient to wait about 3 weeks and to call our office to schedule a lab draw in 3 weeks for antibody testing. In the meantime, we discussed that she should call us if she develops fever, cough, sob, recurrent fatigue. Pt verbalizes understanding.   Follow Up Instructions:    I discussed the assessment and treatment plan with the patient. The patient was provided an opportunity to ask questions and all were answered. The patient agreed with the plan and demonstrated an understanding of the instructions.   The patient was advised to call back or seek an in-person evaluation if the symptoms worsen or if the condition fails to improve as anticipated.  Nance Pear, NP

## 2018-11-03 LAB — HM MAMMOGRAPHY

## 2018-11-06 ENCOUNTER — Encounter: Payer: Self-pay | Admitting: Family Medicine

## 2019-01-23 ENCOUNTER — Other Ambulatory Visit: Payer: Self-pay

## 2019-01-23 ENCOUNTER — Emergency Department (HOSPITAL_BASED_OUTPATIENT_CLINIC_OR_DEPARTMENT_OTHER)
Admission: EM | Admit: 2019-01-23 | Discharge: 2019-01-23 | Disposition: A | Discharge status: Home / Self Care | Payer: BLUE CROSS/BLUE SHIELD | Attending: Emergency Medicine | Admitting: Emergency Medicine

## 2019-01-23 ENCOUNTER — Encounter (HOSPITAL_BASED_OUTPATIENT_CLINIC_OR_DEPARTMENT_OTHER): Payer: Self-pay | Admitting: Emergency Medicine

## 2019-01-23 DIAGNOSIS — Z79899 Other long term (current) drug therapy: Secondary | ICD-10-CM | POA: Insufficient documentation

## 2019-01-23 DIAGNOSIS — R0981 Nasal congestion: Secondary | ICD-10-CM | POA: Diagnosis present

## 2019-01-23 DIAGNOSIS — Z91010 Allergy to peanuts: Secondary | ICD-10-CM | POA: Diagnosis not present

## 2019-01-23 DIAGNOSIS — B349 Viral infection, unspecified: Secondary | ICD-10-CM | POA: Diagnosis not present

## 2019-01-23 DIAGNOSIS — U071 COVID-19: Secondary | ICD-10-CM | POA: Insufficient documentation

## 2019-01-23 MED ORDER — NAPROXEN 500 MG PO TABS: 500.0000 mg | ORAL_TABLET | Freq: Two times a day (BID) | ORAL | 0 refills | Status: AC

## 2019-01-23 MED ORDER — FLUTICASONE PROPIONATE 50 MCG/ACT NA SUSP: 1.0000 | Freq: Every day | NASAL | 0 refills | Status: AC

## 2019-01-23 NOTE — ED Provider Notes (Signed)
[facility] EMERGENCY DEPARTMENT Provider Note   CSN: [id number] Arrival date & time: 01/23/19  1047     History Chief Complaint  Patient presents with  . COVID symptoms    Anne Huffman is a 54 y.o. female with a history of asthma, sickle cell trait, & GERD who presents to the ED with complaints of flulike symptoms for the past 5 days.  Patient states she has had nasal congestion, earaches, scratchy throat, gradual onset headache similar to prior, as well as body aches.  She states she was initially coughing but this is since resolved.  No alleviating or aggravating factors to her symptoms.  She has been taking TheraFlu at home without relief.  Several people have tested positive for COVID-19 at work.  Patient denies fever, chills, vomiting, abdominal pain, diarrhea, chest pain, or dyspnea. HPI     Past Medical History:  Diagnosis Date  . Allergy   . Asthma    since childhood  . Decreased visual acuity 04/21/2014  . Depression    no treatment  . Enlarged thyroid gland 03/26/2016  . Fatigue 08/20/2012  . Gastroenteritis 01/01/2015  . H. pylori infection 06/09/2013  . History of chicken pox    childhood  . Low back pain 03/26/2016  . Mild intermittent asthma 03/11/2011  . Preventative health care 09/21/2012  . Seasonal allergies   . Sickle cell trait ([place])   . Ulcer    stomach  . Vaginitis 03/26/2016    Patient Active Problem List   Diagnosis Date Noted  . Costochondritis 07/01/2018  . Enlarged thyroid gland 03/26/2016  . Low back pain 03/26/2016  . Vaginitis 03/26/2016  . Decreased visual acuity 04/21/2014  . GERD (gastroesophageal reflux disease) 06/09/2013  . Preventative health care 09/21/2012  . Cervical cancer screening 09/18/2012  . Fatigue 08/20/2012  . Dermatitis 07/18/2011  . Lung nodule 05/16/2011  . Neck pain on right side 04/06/2011  . Radicular pain in right arm 04/06/2011  . Breast cancer screening 03/11/2011  . Anemia 03/11/2011  . Abnormal  liver enzymes 03/11/2011  . Mild intermittent asthma 03/11/2011    Past Surgical History:  Procedure Laterality Date  . NO PAST SURGERIES  03/11/2011     OB History   No obstetric history on file.     Family History  Problem Relation Age of Onset  . Breast cancer Maternal Grandmother   . Cancer Maternal Grandmother        breast  . Hypertension Mother        maternal aunt  . Eczema Mother   . Eczema Sister   . Eczema Sister   . Cancer Other        breast cancer  . Prostate cancer Neg Hx   . Colon cancer Neg Hx   . Heart disease Neg Hx   . Diabetes Neg Hx   . Depression Neg Hx   . Stroke Neg Hx   . Esophageal cancer Neg Hx   . Rectal cancer Neg Hx   . Stomach cancer Neg Hx     Social History   Tobacco Use  . Smoking status: Never Smoker  . Smokeless tobacco: Never Used  Substance Use Topics  . Alcohol use: Yes  . Drug use: No    Home Medications Prior to Admission medications   Medication Sig Start Date End Date Taking? Authorizing Provider  EPINEPHrine (EPIPEN 2-PAK) 0.3 mg/0.3 mL IJ SOAJ injection Inject 0.3 mLs (0.3 mg total) into the muscle as  needed for anaphylaxis. 07/22/18   Debbrah Alar, NP  ibuprofen (ADVIL) 800 MG tablet  06/18/18   [provider]    Allergies    Other, Peanuts [peanut oil], and Pork-derived products  Review of Systems   Review of Systems  Constitutional: Negative for chills and fever.  HENT: Positive for congestion, ear pain and sore throat (scratchy).   Respiratory: Positive for cough (resolved @ present). Negative for shortness of breath.   Cardiovascular: Negative for chest pain.  Gastrointestinal: Negative for abdominal pain, blood in stool, constipation, diarrhea and vomiting.  Genitourinary: Negative for dysuria.  Musculoskeletal: Positive for myalgias (generalized).  Neurological: Negative for syncope.    Physical Exam Updated Vital Signs BP 135/82 (BP Location: Left Arm)   Pulse 80   Temp 98.4 F  (36.9 C) (Oral)   Resp 18   Ht 5\' 2"  (1.575 m)   Wt 81.6 kg   LMP 10/27/2015   SpO2 98%   BMI 32.92 kg/m   Physical Exam Vitals and nursing note reviewed.  Constitutional:      General: She is not in acute distress.    Appearance: She is well-developed.  HENT:     Head: Normocephalic and atraumatic.     Right Ear: Ear canal normal. Tympanic membrane is not perforated, erythematous, retracted or bulging.     Left Ear: Ear canal normal. Tympanic membrane is not perforated, erythematous, retracted or bulging.     Ears:     Comments: No mastoid erythema/swelling/tenderness.     Nose:     Right Sinus: No maxillary sinus tenderness or frontal sinus tenderness.     Left Sinus: No maxillary sinus tenderness or frontal sinus tenderness.     Mouth/Throat:     Pharynx: Uvula midline. No oropharyngeal exudate or posterior oropharyngeal erythema.     Comments: Posterior oropharynx is symmetric appearing. Patient tolerating own secretions without difficulty. No trismus. No drooling. No hot potato voice. No swelling beneath the tongue, submandibular compartment is soft.  Eyes:     General:        Right eye: No discharge.        Left eye: No discharge.     Conjunctiva/sclera: Conjunctivae normal.     Pupils: Pupils are equal, round, and reactive to light.  Cardiovascular:     Rate and Rhythm: Normal rate and regular rhythm.     Heart sounds: No murmur.  Pulmonary:     Effort: Pulmonary effort is normal. No respiratory distress.     Breath sounds: Normal breath sounds. No wheezing, rhonchi or rales.  Abdominal:     General: There is no distension.     Palpations: Abdomen is soft.     Tenderness: There is no abdominal tenderness.  Musculoskeletal:     Cervical back: Normal range of motion and neck supple. No edema or rigidity.  Lymphadenopathy:     Cervical: No cervical adenopathy.  Skin:    General: Skin is warm and dry.     Findings: No rash.  Neurological:     Mental Status: She  is alert.  Psychiatric:        Behavior: Behavior normal.     ED Results / Procedures / Treatments   Labs (all labs ordered are listed, but only abnormal results are displayed) Labs Reviewed - No data to display  EKG None  Radiology No results found.  Procedures Procedures (including critical care time)  Medications Ordered in ED Medications - No data to display  ED Course  I have reviewed the triage vital signs and the nursing notes.  Pertinent labs & imaging results that were available during my care of the patient were reviewed by me and considered in my medical decision making (see chart for details).  STARLENA PATA was evaluated in Emergency Department on 01/23/2019 for the symptoms described in the history of present illness. He/she was evaluated in the context of the global COVID-19 pandemic, which necessitated consideration that the patient might be at risk for infection with the SARS-CoV-2 virus that causes COVID-19. Institutional protocols and algorithms that pertain to the evaluation of patients at risk for COVID-19 are in a state of rapid change based on information released by regulatory bodies including the CDC and federal and state organizations. These policies and algorithms were followed during the patient's care in the ED.    MDM Rules/Calculators/A&P                      Patient presents to the emergency department with flulike symptoms for the past 5 days.  Patient nontoxic-appearing, no apparent distress, vitals WNL.  Afebrile, no sinus tenderness, symptoms less than 10 days, doubt acute bacterial sinusitis.  No signs of AOM/AOE/mastoiditis.  Centor score 0, doubt strep.  Lungs are clear to auscultation bilaterally, cough has resolved, do not suspect bacterial pneumonia.  No meningismus.  Abdomen nontender without peritoneal signs.  Likely viral process, outside of Tamiflu window therefore testing deferred, will check for COVID-19.  Supportive care.  Quarantine.  I discussed treatment plan, need for follow-up, and return precautions with the patient. Provided opportunity for questions, patient confirmed understanding and is in agreement with plan.   Final Clinical Impression(s) / ED Diagnoses Final diagnoses:  Viral illness    Rx / DC Orders ED Discharge Orders         Ordered    naproxen (NAPROSYN) 500 MG tablet  2 times daily     01/23/19 1246    fluticasone (FLONASE) 50 MCG/ACT nasal spray  Daily     01/23/19 [address], PA-C 01/23/19 1317    Virgel Manifold, MD 01/24/19 1041

## 2019-01-23 NOTE — ED Triage Notes (Signed)
Headache, runny nose, body aches since Tuesday.

## 2019-01-23 NOTE — Discharge Instructions (Signed)
You are seen in the emergency department today for flulike symptoms.  We have tested you for COVID-19, we will call you if results are positive.  We are sending you home with Flonase to use 1 spray per nostril daily to help with nasal congestion and ear discomfort.  We are also sending home with naproxen to help with pain. Naproxen is a nonsteroidal anti-inflammatory medication that will help with pain and swelling. Be sure to take this medication as prescribed with food, 1 pill every 12 hours,  It should be taken with food, as it can cause stomach upset, and more seriously, stomach bleeding. Do not take other nonsteroidal anti-inflammatory medications with this such as Advil, Motrin, Aleve, Mobic, Goodie Powder, or Motrin.    You make take Tylenol per over the counter dosing with these medications.   We have prescribed you new medication(s) today. Discuss the medications prescribed today with your pharmacist as they can have adverse effects and interactions with your other medicines including over the counter and prescribed medications. Seek medical evaluation if you start to experience new or abnormal symptoms after taking one of these medicines, seek care immediately if you start to experience difficulty breathing, feeling of your throat closing, facial swelling, or rash as these could be indications of a more serious allergic reaction  We have tested you for COVID 19, we will call you within the next 72 hours if results are positive, you may also view these results on MyChart.   We are instructing patient's with COVID 19 or symptoms of COVID 19 to quarantine themselves for 14 days. You may be able to discontinue self quarantine if the following conditions are met:   Persons with COVID-19 who have symptoms and were directed to care for themselves at home may discontinue home isolation under the  following conditions: - It has been at least 7 days have passed since symptoms first appeared. - AND at  least 3 days (72 hours) have passed since recovery defined as resolution of fever without the use of fever-reducing medications and improvement in respiratory symptoms (e.g., cough, shortness of breath)  Please follow the below quarantine instructions.   Please follow up with primary care within 3-5 days for re-evaluation- call prior to going to the office to make them aware of your symptoms as some offices are altering their method of seeing patients with COVID 19 symptoms. Return to the ER for new or worsening symptoms including but not limited to increased work of breathing, chest pain, passing out, or any other concerns.       Person Under Monitoring Name: Anne Huffman  Location: [address] [place] [address]   Infection Prevention Recommendations for Individuals Confirmed to have, or Being Evaluated for, 2019 Novel Coronavirus (COVID-19) Infection Who Receive Care at Home  Individuals who are confirmed to have, or are being evaluated for, COVID-19 should follow the prevention steps below until a healthcare provider or local or state health department says they can return to normal activities.  Stay home except to get medical care You should restrict activities outside your home, except for getting medical care. Do not go to work, school, or public areas, and do not use public transportation or taxis.  Call ahead before visiting your doctor Before your medical appointment, call the healthcare provider and tell them that you have, or are being evaluated for, COVID-19 infection. This will help the healthcare providers office take steps to keep other people from getting  infected. Ask your healthcare provider to call the local or state health department.  Monitor your symptoms Seek prompt medical attention if your illness is worsening (e.g., difficulty breathing). Before going to your medical appointment, call the healthcare provider and tell them that you have,  or are being evaluated for, COVID-19 infection. Ask your healthcare provider to call the local or state health department.  Wear a facemask You should wear a facemask that covers your nose and mouth when you are in the same room with other people and when you visit a healthcare provider. People who live with or visit you should also wear a facemask while they are in the same room with you.  Separate yourself from other people in your home As much as possible, you should stay in a different room from other people in your home. Also, you should use a separate bathroom, if available.  Avoid sharing household items You should not share dishes, drinking glasses, cups, eating utensils, towels, bedding, or other items with other people in your home. After using these items, you should wash them thoroughly with soap and water.  Cover your coughs and sneezes Cover your mouth and nose with a tissue when you cough or sneeze, or you can cough or sneeze into your sleeve. Throw used tissues in a lined trash can, and immediately wash your hands with soap and water for at least 20 seconds or use an alcohol-based hand rub.  Wash your [facility] your hands often and thoroughly with soap and water for at least 20 seconds. You can use an alcohol-based hand sanitizer if soap and water are not available and if your hands are not visibly dirty. Avoid touching your eyes, nose, and mouth with unwashed hands.   Prevention Steps for Caregivers and Household Members of Individuals Confirmed to have, or Being Evaluated for, COVID-19 Infection Being Cared for in the Home  If you live with, or provide care at home for, a person confirmed to have, or being evaluated for, COVID-19 infection please follow these guidelines to prevent infection:  Follow healthcare providers instructions Make sure that you understand and can help the patient follow any healthcare provider instructions for all care.  Provide for the  patients basic needs You should help the patient with basic needs in the home and provide support for getting groceries, prescriptions, and other personal needs.  Monitor the patients symptoms If they are getting sicker, call his or her medical provider and tell them that the patient has, or is being evaluated for, COVID-19 infection. This will help the healthcare providers office take steps to keep other people from getting infected. Ask the healthcare provider to call the local or state health department.  Limit the number of people who have contact with the patient If possible, have only one caregiver for the patient. Other household members should stay in another home or place of residence. If this is not possible, they should stay in another room, or be separated from the patient as much as possible. Use a separate bathroom, if available. Restrict visitors who do not have an essential need to be in the home.  Keep older adults, very young children, and other sick people away from the patient Keep older adults, very young children, and those who have compromised immune systems or chronic health conditions away from the patient. This includes people with chronic heart, lung, or kidney conditions, diabetes, and cancer.  Ensure good ventilation Make sure that shared spaces in the home  have good air flow, such as from an air conditioner or an opened window, weather permitting.  Wash your hands often Wash your hands often and thoroughly with soap and water for at least 20 seconds. You can use an alcohol based hand sanitizer if soap and water are not available and if your hands are not visibly dirty. Avoid touching your eyes, nose, and mouth with unwashed hands. Use disposable paper towels to dry your hands. If not available, use dedicated cloth towels and replace them when they become wet.  Wear a facemask and gloves Wear a disposable facemask at all times in the room and gloves when  you touch or have contact with the patients blood, body fluids, and/or secretions or excretions, such as sweat, saliva, sputum, nasal mucus, vomit, urine, or feces.  Ensure the mask fits over your nose and mouth tightly, and do not touch it during use. Throw out disposable facemasks and gloves after using them. Do not reuse. Wash your hands immediately after removing your facemask and gloves. If your personal clothing becomes contaminated, carefully remove clothing and launder. Wash your hands after handling contaminated clothing. Place all used disposable facemasks, gloves, and other waste in a lined container before disposing them with other household waste. Remove gloves and wash your hands immediately after handling these items.  Do not share dishes, glasses, or other household items with the patient Avoid sharing household items. You should not share dishes, drinking glasses, cups, eating utensils, towels, bedding, or other items with a patient who is confirmed to have, or being evaluated for, COVID-19 infection. After the person uses these items, you should wash them thoroughly with soap and water.  Wash laundry thoroughly Immediately remove and wash clothes or bedding that have blood, body fluids, and/or secretions or excretions, such as sweat, saliva, sputum, nasal mucus, vomit, urine, or feces, on them. Wear gloves when handling laundry from the patient. Read and follow directions on labels of laundry or clothing items and detergent. In general, wash and dry with the warmest temperatures recommended on the label.  Clean all areas the individual has used often Clean all touchable surfaces, such as counters, tabletops, doorknobs, bathroom fixtures, toilets, phones, keyboards, tablets, and bedside tables, every day. Also, clean any surfaces that may have blood, body fluids, and/or secretions or excretions on them. Wear gloves when cleaning surfaces the patient has come in contact with. Use  a diluted bleach solution (e.g., dilute bleach with 1 part bleach and 10 parts water) or a household disinfectant with a label that says EPA-registered for coronaviruses. To make a bleach solution at home, add 1 tablespoon of bleach to 1 quart (4 cups) of water. For a larger supply, add  cup of bleach to 1 gallon (16 cups) of water. Read labels of cleaning products and follow recommendations provided on product labels. Labels contain instructions for safe and effective use of the cleaning product including precautions you should take when applying the product, such as wearing gloves or eye protection and making sure you have good ventilation during use of the product. Remove gloves and wash hands immediately after cleaning.  Monitor yourself for signs and symptoms of illness Caregivers and household members are considered close contacts, should monitor their health, and will be asked to limit movement outside of the home to the extent possible. Follow the monitoring steps for close contacts listed on the symptom monitoring form.   ? If you have additional questions, contact your local health department or call the  epidemiologist on call at [phone] (available 24/7). ? This guidance is subject to change. For the most up-to-date guidance from [facility], please refer to their website: [email or web address]

## 2019-01-25 ENCOUNTER — Telehealth (HOSPITAL_COMMUNITY): Payer: Self-pay

## 2019-01-25 LAB — NOVEL CORONAVIRUS, NAA (HOSP ORDER, SEND-OUT TO REF LAB; TAT 18-24 HRS): SARS-CoV-2, NAA: DETECTED — AB

## 2023-03-18 ENCOUNTER — Encounter: Payer: Self-pay | Admitting: Gastroenterology
